# Patient Record
Sex: Male | Born: 1950 | Race: White | Hispanic: No | Marital: Married | Smoking: Never smoker
Health system: Southern US, Community
[De-identification: ages and names within clinical notes are randomized; demographics above are authoritative.]

## PROBLEM LIST (undated history)

## (undated) DIAGNOSIS — E7800 Pure hypercholesterolemia, unspecified: Secondary | ICD-10-CM

## (undated) DIAGNOSIS — I2510 Atherosclerotic heart disease of native coronary artery without angina pectoris: Secondary | ICD-10-CM

## (undated) DIAGNOSIS — I10 Essential (primary) hypertension: Secondary | ICD-10-CM

## (undated) DIAGNOSIS — E119 Type 2 diabetes mellitus without complications: Secondary | ICD-10-CM

## (undated) DIAGNOSIS — F419 Anxiety disorder, unspecified: Secondary | ICD-10-CM

## (undated) HISTORY — DX: Type 2 diabetes mellitus without complications: E11.9

## (undated) HISTORY — DX: Pure hypercholesterolemia, unspecified: E78.00

## (undated) HISTORY — DX: Atherosclerotic heart disease of native coronary artery without angina pectoris: I25.10

## (undated) HISTORY — DX: Anxiety disorder, unspecified: F41.9

---

## 1999-09-10 ENCOUNTER — Inpatient Hospital Stay (HOSPITAL_COMMUNITY): Admission: EM | Admit: 1999-09-10 | Discharge: 1999-09-20 | Payer: Self-pay | Admitting: Surgery

## 1999-09-10 ENCOUNTER — Encounter (INDEPENDENT_AMBULATORY_CARE_PROVIDER_SITE_OTHER): Payer: Self-pay | Admitting: *Deleted

## 1999-09-10 ENCOUNTER — Encounter: Payer: Self-pay | Admitting: Surgery

## 1999-09-14 ENCOUNTER — Encounter: Payer: Self-pay | Admitting: General Surgery

## 1999-11-24 ENCOUNTER — Encounter: Payer: Self-pay | Admitting: Surgery

## 1999-11-24 ENCOUNTER — Encounter: Admission: RE | Admit: 1999-11-24 | Discharge: 1999-11-24 | Payer: Self-pay | Admitting: Surgery

## 2006-06-21 HISTORY — PX: ROTATOR CUFF REPAIR: SHX139

## 2007-01-15 ENCOUNTER — Encounter: Admission: RE | Admit: 2007-01-15 | Discharge: 2007-01-15 | Payer: Self-pay | Admitting: Orthopedic Surgery

## 2007-03-02 ENCOUNTER — Encounter: Admission: RE | Admit: 2007-03-02 | Discharge: 2007-04-19 | Payer: Self-pay | Admitting: Orthopedic Surgery

## 2007-11-14 ENCOUNTER — Encounter: Admission: RE | Admit: 2007-11-14 | Discharge: 2007-11-14 | Payer: Self-pay | Admitting: Family Medicine

## 2010-11-06 NOTE — Discharge Summary (Signed)
[place]. [facility]  Patient:    Robert Mooney, Robert Mooney                      MRN: [id number] Adm. Date:  [id number] Disc. Date: [id number] Attending:  Andre Lefort CC:         Nicki Reaper, M.D.             Elpidio Anis, M.D., [place], [place]                           Discharge Summary  DATE OF BIRTH:  [birth year]  DISCHARGE DIAGNOSES: 1. Diverticulitis with intra-abdominal abscesses. 2. Hypokalemia. 3. Normal appendix. 4. History of left elbow pain. 5. Moderate obesity.  OPERATIONS:  The patient had a laparoscopic examination with laparoscopic appendectomy and drainage of intra-abdominal abscesses on September 16, 1999.  HISTORY OF PRESENT ILLNESS:  Mr. Tisdale is a 60 year old white male who has no primary doctor.  He has been seeing Dr. Cindee Salt for left elbow pain.  He developed vague abdominal discomfort on Monday, September 07, 1999.  This pain became more severe, and he complained of pain radiating to the end of his penis and rectum.  His initial evaluation in the emergency room was negative, but then he re-presented to [facility] for what was felt to be, by CT scan, low sigmoid rectal inflammation.  He was admitted by Dr. Elpidio Anis, but had protracted vomiting and requested transfer to [facility].  On September 10, 1999, he was transferred to my service at [facility].  The patient gives no prior gastrointestinal history.  He is moderately obese. It is interesting that about three days before this all began he gave himself an enema because of some mild constipation.  ALLERGIES:  He denied any allergies.  MEDICATIONS:  Only medication includes Celebrex for his elbow.  PAST SURGICAL HISTORY:  Bilateral carpal tunnel surgery and right elbow surgery by Dr. Merlyn Lot.  SOCIAL HISTORY:  The patient worked Public librarian.  PHYSICAL EXAMINATION:  On admission his temperature was 98.8, pulse of 80.  He had lower abdominal  tenderness, right being worse than the left, and he was obese.  HOSPITAL COURSE:  Initial assessment was that he probably had diverticulitis. I doubted he had a rectal perforation, though his history of an enema was interesting.  We placed him on Unasyn and gentamicin which had been started at [facility] and continued this.  He did develop some mild hypokalemia during this time which was treated.  The patient seemed to get better to some extent clinically, though continued to complain of right lower quadrant abdominal pain, and this is where the most ______ inflammatory mass was on a repeat CT scan done on September 14, 1999.  In reviewing this with Dr. Maple Hudson, there was concern whether he had an appendicitis which had not been diagnosed.  I discussed this thoroughly with him about the treatment options and, on September 16, 1999, he was taken to the operating room where he underwent an exploratory laparoscopy.  His appendix was noted to be normal, but was removed.  He had three abscesses, the largest of which was in the right lower quadrant, a smaller one in the left lower quadrant, and one in the pelvis.  These were all drained through the laparoscope.  The patients antibiotics were continued.  By the second postoperative day on September 18, 1999, he was started on clear liquids, and by September 20, 1999, he was afebrile, feeling better, and ready for discharge.  I planned to keep him on the Cipro and Flagyl for six days after discharge, which would be a total of 10 days of antibiotics.  He was given Vicodin for pain.  He was given Cipro 1 tablet twice a day, Flagyl 1 tablet four times a day.  He will see me back in 7-10 days.  Stay on a low fat diet.  Call for any intervening problems.  The final pathology of his appendix showed periappendicitis consistent with probable external abscess.  CONDITION ON DISCHARGE:  Good. DD:  10/18/99 TD:  10/18/99 Job: [id number]

## 2010-11-06 NOTE — H&P (Signed)
[place]. [facility]  Patient:    Robert Mooney, Robert Mooney                      MRN: [id number] Adm. Date:  [id number] Attending:  Andre Lefort CC:         Nicki Reaper, M.D.             Elpidio Anis, M.D.                         History and Physical  DATE OF BIRTH:  February 06, 1951  HISTORY OF PRESENT ILLNESS:  Robert Mooney is a 60 year old white male who has no primary doctor.  In fact, Robert Mooney kind of brags about not seeing a physician Robert Mooney entire life.  Robert Mooney has seen Dr. Cindee Salt for a bilateral carpal tunnel surgery and is currently being treated with Celebrex for left elbow pain.  The patient was in Robert Mooney usual good health when on Monday the 19th of March 2001 e just got to where Robert Mooney did not feel good.  Robert Mooney started having some diarrhea, went home, then some time that evening developed severe pain which started in Robert Mooney penis and rectum and Robert Mooney thought went up inside of him.  Robert Mooney had some friends who had had kidney stones and they suggested maybe Robert Mooney could be having a kidney stone attack. The patient himself has had no history of kidney stones.  I think Robert Mooney has a family history of kidney stones.  Robert Mooney was taken to [facility] Emergency Room Monday evening, was evaluated, and by Robert Mooney story nothing was found.  Was discharged home.  Robert Mooney returned to [place], however, within an hour or two of returning home early Tuesday morning developed severe pain.  Went to [employer] office who called the ambulance who took him back to [facility] Emergency Room.  Robert Mooney was then evaluated and admitted by Dr. Katrinka Blazing.  Robert Mooney was found to have a white blood count of 16,200 with 20% bands.  A repeat CT scan showed some localized free perirectal gas and free pelvic fluid compatible with a questionable low sigmoid, high rectal perforation.  A questionable diagnosis was that of diverticular disease.  Patient strongly denies any rectal trauma or injury.  Robert Mooney is single but Robert Mooney wife died a  number of years ago.  Robert Mooney lives by himself.  The patient denies any prior history of peptic ulcer disease, liver disease, pancreatic disease, colon disease, or abdominal surgery.  Again, as mentioned previously, Robert Mooney really has no medical doctor that follows him.  During Robert Mooney hospitalization at [facility] Robert Mooney had trouble with diarrhea stools in  which Robert Mooney had a bowel movement every one to two hours which were loose.  Robert Mooney had protracted nausea and vomiting and then this morning the 22nd of March 2001 requested transfer to [facility].  ALLERGIES:  None.  MEDICATIONS:  Celebrex prior to this admission.  PAST SURGICAL HISTORY:  Bilateral carpal tunnel surgery and right elbow surgery by Dr. Cindee Salt.  Is currently being evaluated by Dr. Merlyn Lot for left elbow pain.  REVIEW OF SYSTEMS:  PULMONARY:  Denies history of pneumonia, tuberculosis. CARDIAC:  Robert Mooney has had no chest pain.  No hypertension.  No cardiac evaluation. GASTROINTESTINAL:  See history of present illness.  UROLOGIC:  No kidney stones or prostate trouble.  SOCIAL HISTORY:  Robert Mooney works for [employer].  PHYSICAL EXAMINATION:  VITAL  SIGNS:  Temperature 98.8.  Pulse 80.  Blood pressure 140/85.  GENERAL:  Robert Mooney is a heavy man who is alert.  Speaks with somewhat a rapid speech.  HEENT:  Unremarkable.  NECK:  Supple.  I feel no mass, no thyromegaly.  LUNGS:  Clear to auscultation and symmetric.  HEART:  Regular rate and rhythm.  ABDOMEN:  Obese.  Robert Mooney is tender with some guarding along Robert Mooney right side, more right lower quadrant than right upper quadrant.  Actually, Robert Mooney left side is surprisingly free of pain or discomfort.  I see no evidence of abdominal hernia, though Robert Mooney obesity probably limits to some extent Robert Mooney exam.  GENITOURINARY:  Robert Mooney has no testicular pain or penile trauma.  RECTAL:  Unremarkable for mass, tenderness.  Robert Mooney prostate is unremarkable.  LABORATORIES:  Pending at the time of  dictation.  ASSESSMENT: 1. 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  Robert Mooney appears clinically better    than Robert Mooney was a couple of days ago on the IV antibiotics.  Will update Robert Mooney CBC and    laboratories.  Consider repeating CT scan within one to three days and follow    clinically. 2. History of intractable nausea, though Robert Mooney seems to be doing better at this    current time. 3. Arthritis of Robert Mooney left elbow. DD:  09/10/99 TD:  09/10/99 Job: [id number]

## 2010-11-06 NOTE — Op Note (Signed)
[place]. [facility]  Patient:    Robert Mooney, Robert Mooney                      MRN: [id number] Proc. Date: 09/16/99 Adm. Date:  [id number] Attending:  Andre Lefort                           Operative Report  DATE OF BIRTH:  November 27, 1950.  PREOPERATIVE DIAGNOSIS:  Possible ruptured appendicitis.  POSTOPERATIVE DIAGNOSIS:  Normal-appearing appendix with inflammatory mass and changes of his cecum and right lower quadrant, three small abscesses of the right lower quadrant, left lower quadrant and prepelvic area, probable diverticulitis of the midsigmoid colon.  OPERATION PERFORMED:  Laparoscopic appendectomy, drainage of intra-abdominal abscesses, and irrigation laparoscopically.  SURGEON:  Sandria Bales. Ezzard Standing, M.D.  ASSISTANT:  Thornton Park. Daphine Deutscher, M.D.  ANESTHESIA:  General endotracheal.  ESTIMATED BLOOD LOSS:  50 cc.  DRAINS:  None.  DESCRIPTION OF PROCEDURE:  The patient was placed in a supine position and given a general endotracheal anesthesia.  His left arm was tucked to his side.  His Foley catheter was placed.  He was already on antibiotics of Unasyn and gentamicin. is abdomen was prepped with Betadine solution and sterilely draped.  An infraumbilical incision was made with sharp dissection carried down to the abdominal cavity. pon entering the abdominal cavity ____________ inflammatory mass was noted to be in  the right lower quadrant.  I put two additional trocars, a 5 mm right subcostal  trocar and a 10 mm trocar midway between the umbilicus and the xiphoid bone.  I was able to then ____________ dissection.  I discovered an abscess in the left lower quadrant which I aspirated and drained.  I discovered an abscess in the right lower quadrant which I aspirated and drained, and a prepelvic abscess.  I took the wall of one of the abscesses and sent it for culture and sensitivity. The patient had primarily an inflammatory mass in the  right lower quadrant.  I was able to take, however, all this omentum down, open up the bowel loops but identified no primary bowel problem.  I was able to free up the appendix.  The appendix itself looked  grossly normal.  I freed up the base of the appendix using both a Harmonic scalpel. and clips.  In the mesentery of the appendix and base of the appendix I used a vascular endo GIA stapler divided and delivered the appendix in an Endocatch bag and sent it to pathology.  The cecum itself had no obvious inflammation, no evidence of tumor.  There was no evidence of Crohns disease.  The terminal bowel though inflamed, was otherwise soft and pliable without evidence of bowel injury.  I irrigated the abdomen with about 5 liters of saline.  I think the primary point of his injury was probably a midsigmoid colon diverticulitis.  I did not try to  take this entirely down.  I think it is pretty well walled off.  I stopped my operation after thorough irrigation and felt that I had irrigated his abscesses out well, will continue on IV antibiotics and go from there.  The trocars were then all removed under direct visualization.  There was no bleeding from any trocar site.  The umbilical trocar was closed with a 0 Vicryl  suture that I had placed as a pursestring for the original placement of the Hasson trocar.  I then closed the skin with a 5-0 subcuticular Vicryl suture, painted ith tincture of benzoin, Steri-Strips and sterilely dressed.  The patient tolerated the procedure well was transported to recovery room in good condition. DD:  09/16/99 TD:  09/17/99 Job: [id number]

## 2014-07-16 ENCOUNTER — Other Ambulatory Visit (HOSPITAL_BASED_OUTPATIENT_CLINIC_OR_DEPARTMENT_OTHER): Payer: Self-pay | Admitting: Physician Assistant

## 2014-07-16 DIAGNOSIS — M5430 Sciatica, unspecified side: Secondary | ICD-10-CM

## 2014-07-20 ENCOUNTER — Ambulatory Visit (HOSPITAL_BASED_OUTPATIENT_CLINIC_OR_DEPARTMENT_OTHER)
Admission: RE | Admit: 2014-07-20 | Discharge: 2014-07-20 | Disposition: A | Discharge status: Home / Self Care | Payer: Medicare Other | Source: Ambulatory Visit | Attending: Physician Assistant | Admitting: Physician Assistant

## 2014-07-20 DIAGNOSIS — M25552 Pain in left hip: Secondary | ICD-10-CM | POA: Diagnosis not present

## 2014-07-20 DIAGNOSIS — M79604 Pain in right leg: Secondary | ICD-10-CM | POA: Insufficient documentation

## 2014-07-20 DIAGNOSIS — M79605 Pain in left leg: Secondary | ICD-10-CM | POA: Diagnosis not present

## 2014-07-20 DIAGNOSIS — M25551 Pain in right hip: Secondary | ICD-10-CM | POA: Insufficient documentation

## 2014-07-20 DIAGNOSIS — M5430 Sciatica, unspecified side: Secondary | ICD-10-CM | POA: Diagnosis present

## 2014-07-20 DIAGNOSIS — M5136 Other intervertebral disc degeneration, lumbar region: Secondary | ICD-10-CM | POA: Insufficient documentation

## 2015-04-08 ENCOUNTER — Ambulatory Visit (INDEPENDENT_AMBULATORY_CARE_PROVIDER_SITE_OTHER): Payer: Medicare Other | Admitting: Neurology

## 2015-04-08 ENCOUNTER — Encounter: Payer: Self-pay | Admitting: Neurology

## 2015-04-08 VITALS — BP 134/77 | HR 72 | Ht 68.0 in | Wt 221.8 lb

## 2015-04-08 DIAGNOSIS — G609 Hereditary and idiopathic neuropathy, unspecified: Secondary | ICD-10-CM

## 2015-04-08 DIAGNOSIS — M5441 Lumbago with sciatica, right side: Secondary | ICD-10-CM

## 2015-04-08 DIAGNOSIS — G8929 Other chronic pain: Secondary | ICD-10-CM

## 2015-04-08 DIAGNOSIS — M48061 Spinal stenosis, lumbar region without neurogenic claudication: Secondary | ICD-10-CM

## 2015-04-08 DIAGNOSIS — M5442 Lumbago with sciatica, left side: Secondary | ICD-10-CM | POA: Diagnosis not present

## 2015-04-08 DIAGNOSIS — M4806 Spinal stenosis, lumbar region: Secondary | ICD-10-CM | POA: Diagnosis not present

## 2015-04-08 NOTE — Progress Notes (Signed)
[facility]    Provider:  Dr Jaynee Eagles Referring Provider: Corine Shelter, PA-C Primary Care Physician:  Beatris Si  CC:  Numbness in the feet  HPI:  Robert Mooney is a 64 y.o. male here as a referral from Dr. Aron Baba for numbness in the feet. PMHx of DM, HLD, anxiety, chronic LBP and spinal stenosis. He has left arm pain with shooting pains in the left arm all the way down to the wrist. He put an ice pack on his arm, took gabapentin, severe pain, It felt like a needle in his arm. He put ice on his low back and it went away. Has not had a repeat incident of left arm pain. No neck pain. He has less severe arm pain in the past  but it was positional. His fingers have been going numb for a long time. The left fingers will go numb and he has decreased grip and dropping things. He denies headaches, double vision, changes in speech or swallow. He is having vision changes and facial weakness with drooling. He has unsteadiness. Episode of confusion and dizziness. He has right-sided sciatica. He has to put ice on his back to walk. He can't sit in his kayak and paddle because his feet go numb. He legs go numb and they ache. He has needle pains into the foot. He has sharp pains in the right sciatic nerve. The pain radiates into the buttocks and thighs. He has numbness in the feet only when riding in the kayak; the kayak has foot braces and he has to keep his legs straight for long period of time and then he has numbness in his feet and it goes up to the ankles. No numbness otherwise, no cramping in the feet, he has some balance problems and feels unstable when he first gets up out of chair. Pain in the feet are positional, no continuous burning or tingling or sensory symptoms. He endorses his sensory symptoms in the feet only when he is in the kayak, resolves when he gets out of the kayak. No bowel or bladder dysfunction.   Reviewed notes, labs and imaging from outside physicians, which  showed: he was sent by [facility] by Dr. Nelva Bush for chronic LBP and radicular symptoms and numbness in the feet to evaluate for any other etiology of his biltaeral foot numbness. He is on Neurontin for the pain. He has had low back injections and ESI without significant relief.   MRI of the lumbar spine 06/2014: personally reviewed and agree with following:  Disc levels:  L1-L2: Negative.  L2-L3: Mild disc desiccation. Shallow circumferential disc bulge without stenosis.  L3-L4: Development of disc degeneration compared to the prior exam. Shallow broad-based posterior disc bulging without resulting stenosis. Central canal is capacious. 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 The RIGHT neural foramen appears patent.  L4-L5: Disc desiccation and degeneration. RIGHT eccentric broad-based posterior disc bulging is present. Bulging disc contacts both descending L5 nerves without compression. There is mild symmetric bilateral foraminal stenosis secondary to short pedicles and disc bulging.  L5-S1: Disc degeneration. Small central annular fissure and shallow central protrusion that appears similar to the prior exam. The protrusion approaches both descending S1 nerves in the subarticular zones but there is no compression or effacement of CSF in the nerve root sleeves. Both neural foramina are patent.  IMPRESSION: Mild progression of degenerative disc disease, most notable L3-L4. Other levels  appear similar to the prior study. Congenitally short pedicles with mild superimposed disc bulging.  Review of Systems: Patient complains of symptoms per HPI as well as the following symptoms: restless legs, anxiety. Pertinent negatives per HPI. All others negative.   Social History   Social History  . Marital Status: Married    Spouse Name: Enid Derry   . Number of Children: 0  . Years of  Education: 12   Occupational History  . Retired    Social History Main Topics  . Smoking status: Never Smoker   . Smokeless tobacco: Not on file  . Alcohol Use: No  . Drug Use: No  . Sexual Activity: Not on file   Other Topics Concern  . Not on file   Social History Narrative   Lives with wife, Enid Derry.   Caffeine use: Drinks coffee (2 cups per day)    Family History  Problem Relation Age of Onset  . Neuropathy Neg Hx     Past Medical History  Diagnosis Date  . Diabetes ([place])   . High cholesterol   . Anxiety     Past Surgical History  Procedure Laterality Date  . Rotator cuff repair Right 2008    Current Outpatient Prescriptions  Medication Sig Dispense Refill  . ACCU-CHEK AVIVA PLUS test strip TEST BLOOD SUGAR ONCE A DAY  11  . Acetaminophen (TYLENOL EXTRA STRENGTH PO) Take 1 tablet by mouth as needed.     . gabapentin (NEURONTIN) 300 MG capsule TAKE ONE CAPSULE BY MOUTH AT BEDTIME X 5 DAYS, 1 CAP TWICE DAILY X 5 DAYS, 1 CAP 3 TIMES DAILY  0  . lisinopril (PRINIVIL,ZESTRIL) 5 MG tablet Take 5 mg by mouth daily.  0  . metFORMIN (GLUCOPHAGE) 500 MG tablet Take 500 mg by mouth 2 (two) times daily.  1  . naproxen sodium (ANAPROX) 220 MG tablet Take 220 mg by mouth 2 (two) times daily with a meal.     . Omega-3 Fatty Acids (FISH OIL PO) Take 2 capsules by mouth daily. 1000     No current facility-administered medications for this visit.    Allergies as of 04/08/2015  . (No Known Allergies)    Vitals: BP 134/77 mmHg  Pulse 72  Ht 5\' 8"  (1.727 m)  Wt 221 lb 12.8 oz (100.608 kg)  BMI 33.73 kg/m2 Last Weight:  Wt Readings from Last 1 Encounters:  04/08/15 221 lb 12.8 oz (100.608 kg)   Last Height:   Ht Readings from Last 1 Encounters:  04/08/15 5\' 8"  (1.727 m)   Physical exam: Exam: Gen: NAD, conversant, well nourised, obese, well groomed                     CV: RRR, no MRG. No Carotid Bruits. No peripheral edema, warm, nontender Eyes: Conjunctivae  clear without exudates or hemorrhage  Neuro: Detailed Neurologic Exam  Speech: mildly dysarthric    Speech is normal; fluent and spontaneous with normal comprehension.  Cognition:    The patient is oriented to person, place, and time;     recent and remote memory intact;     language fluent;     normal attention, concentration,     fund of knowledge Cranial Nerves:    The pupils are equal, round, and reactive to light. Attempted findoscopic exam coul dnot visualize due to small pupils. Visual fields are full to finger confrontation. Extraocular movements are intact. Trigeminal sensation is intact and the muscles of mastication are normal.  The face is symmetric. The palate elevates in the midline. Hearing intact to voice. Voice is normal. Shoulder shrug is normal. The tongue has normal motion without fasciculations.   Coordination:    No dysmetria   Gait:    Not ataxic  Motor Observation:    No asymmetry, no atrophy, and no involuntary movements noted. Tone:    Normal muscle tone.    Posture:    Posture is normal. normal erect    Strength:    Strength is intact  in the upper and lower limbs.      Sensation: intact to LT. Decreased temperature distally, intact pin prick, intact vibration and intact proprioception     Reflex Exam:  DTR's:    Deep tendon reflexes in the upper and lower extremities are brisk bilaterally.   Toes:    The toes are equivocal bilaterally.   Clonus:    Clonus is absent.       Assessment/Plan:  a 64 y.o. male here as a referral from Dr. Aron Baba for numbness in the feet. PMHx of DM, HLD, anxiety, chronic LBP and spinal stenosis. He has some very mild sensoty changes in the feet likely secondary to diabetic neuropathy however I feel the numbness in his feet is secondary to his spinal stenosis since the significant numbness is positional an din the Kayak only. Still will order recommend a serum neuropathy panel for other cause of his mild distal  sensory changes to his pcp. For his diziness, imbalance and neck pain with radicular symptoms I offered patient an MRI of the brain, an MRI of the cervical cord and emg/ncs however he does not want to underatake this at this time. Symptoms in his arm have resolved, he has no significant neck pain and he feels dizziness is only upon standing (which could be mild orthostasis).   Positional numbness in the feet likely secondary to his degenerative lumbar spine disease and spinal stenosis Offered MRI of the brain and cervical cord for dizziness and imbalance and cervical radiculopathy, they decline For his mild sensory changes on exam, suggest neuropathy workup for pcp, patient prefers me to send my suggestions to his pcp instead of doing them here in clinic. I do suggest a TSH, B12 and folate, Serum IFE and SPEP,  if not already completed. Can follow up with me as needed.   CC: Drs Aron Baba and Campbell Riches, [facility] [address] [place], [place] [address]  Phone [phone] Fax [phone]

## 2015-04-08 NOTE — Patient Instructions (Signed)

## 2015-04-11 ENCOUNTER — Encounter: Payer: Self-pay | Admitting: Neurology

## 2015-04-11 DIAGNOSIS — M48061 Spinal stenosis, lumbar region without neurogenic claudication: Secondary | ICD-10-CM | POA: Insufficient documentation

## 2015-04-11 DIAGNOSIS — M5442 Lumbago with sciatica, left side: Secondary | ICD-10-CM

## 2015-04-11 DIAGNOSIS — E119 Type 2 diabetes mellitus without complications: Secondary | ICD-10-CM | POA: Insufficient documentation

## 2015-04-11 DIAGNOSIS — G8929 Other chronic pain: Secondary | ICD-10-CM | POA: Insufficient documentation

## 2015-04-11 DIAGNOSIS — M5441 Lumbago with sciatica, right side: Secondary | ICD-10-CM

## 2015-04-11 DIAGNOSIS — E1169 Type 2 diabetes mellitus with other specified complication: Secondary | ICD-10-CM | POA: Insufficient documentation

## 2015-04-11 DIAGNOSIS — G609 Hereditary and idiopathic neuropathy, unspecified: Secondary | ICD-10-CM | POA: Insufficient documentation

## 2015-06-09 ENCOUNTER — Ambulatory Visit (INDEPENDENT_AMBULATORY_CARE_PROVIDER_SITE_OTHER): Payer: Medicare Other | Admitting: Neurology

## 2015-06-09 ENCOUNTER — Encounter: Payer: Self-pay | Admitting: Neurology

## 2015-06-09 VITALS — BP 156/91 | HR 65 | Ht 68.0 in | Wt 226.6 lb

## 2015-06-09 DIAGNOSIS — G8929 Other chronic pain: Secondary | ICD-10-CM

## 2015-06-09 DIAGNOSIS — M5442 Lumbago with sciatica, left side: Secondary | ICD-10-CM

## 2015-06-09 DIAGNOSIS — M5441 Lumbago with sciatica, right side: Secondary | ICD-10-CM | POA: Diagnosis not present

## 2015-06-09 MED ORDER — PREGABALIN 50 MG PO CAPS: 50.0000 mg | ORAL_CAPSULE | Freq: Two times a day (BID) | ORAL | Status: DC

## 2015-06-09 NOTE — Patient Instructions (Addendum)
Remember to drink plenty of fluid, eat healthy meals and do not skip any meals. Try to eat protein with a every meal and eat a healthy snack such as fruit or nuts in between meals. Try to keep a regular sleep-wake schedule and try to exercise daily, particularly in the form of walking, 20-30 minutes a day, if you can.   As far as your medications are concerned, I would like to suggest: Discuss Lyrica with Dr. Nelva Bush. Will start at 50mg  twice daily. Take Gabapentin 300mg  three times a day for 3 days then stop.   Our phone number is [phone]. We also have an after hours call service for urgent matters and there is a physician on-call for urgent questions. For any emergencies you know to call 911 or go to the nearest emergency room

## 2015-06-09 NOTE — Progress Notes (Signed)
[facility]    Provider:  Dr Jaynee Eagles Referring Provider: Corine Shelter, PA-C Primary Care Physician:  Beatris Si  CC: Numbness in the feet  Interval History 06/09/2015: Robert Mooney is a 64 y.o. male here as a follow up for chronic low back pain.  PMHx of DM, HLD, anxiety, chronic LBP and spinal stenosis. Patient is here for follow-up. He has been evaluated by orthopedic surgery and was told there is no surgical intervention available for his low back symptoms. He continues to have significant pain in the low back which radiates down his legs. He has been treated in pain management clinic and a spinal stimulator has been recommended. He endorses bilateral severe lumbar low back pain and lumbar radiculopathy. The pain in his low back is worse when sitting or laying down for too long and he gets very stiff. He came in today because he would like to know if there is anything that can be done to fix his low back pain with radicular symptoms. Had a long discussion with patient and wife, if there is no surgical intervention then medical management, exercise, weight loss, physical therapy, and pain management recommended.   HPI: Robert Mooney is a 64 y.o. male here as a referral from Dr. Aron Baba for numbness in the feet. PMHx of DM, HLD, anxiety, chronic LBP and spinal stenosis. He has left arm pain with shooting pains in the left arm all the way down to the wrist. He put an ice pack on his arm, took gabapentin, severe pain, It felt like a needle in his arm. He put ice on his low back and it went away. Has not had a repeat incident of left arm pain. No neck pain. He has less severe arm pain in the past but it was positional. His fingers have been going numb for a long time. The left fingers will go numb and he has decreased grip and dropping things. He denies headaches, double vision, changes in speech or swallow. He is having vision changes and facial weakness with drooling. He  has unsteadiness. Episode of confusion and dizziness. He has right-sided sciatica. He has to put ice on his back to walk. He can't sit in his kayak and paddle because his feet go numb. He legs go numb and they ache. He has needle pains into the foot. He has sharp pains in the right sciatic nerve. The pain radiates into the buttocks and thighs. He has numbness in the feet only when riding in the kayak; the kayak has foot braces and he has to keep his legs straight for long period of time and then he has numbness in his feet and it goes up to the ankles. No numbness otherwise, no cramping in the feet, he has some balance problems and feels unstable when he first gets up out of chair. Pain in the feet are positional, no continuous burning or tingling or sensory symptoms. He endorses his sensory symptoms in the feet only when he is in the kayak, resolves when he gets out of the kayak. No bowel or bladder dysfunction.   Reviewed notes, labs and imaging from outside physicians, which showed: he was sent by [facility] by Dr. Nelva Bush for chronic LBP and radicular symptoms and numbness in the feet to evaluate for any other etiology of his biltaeral foot numbness. He is on Neurontin for the pain. He has had low back injections and ESI without significant relief.   MRI of the lumbar spine  06/2014: personally reviewed and agree with following:  Disc levels:  L1-L2: Negative.  L2-L3: Mild disc desiccation. Shallow circumferential disc bulge without stenosis.  L3-L4: Development of disc degeneration compared to the prior exam. Shallow broad-based posterior disc bulging without resulting stenosis. Central canal is capacious. 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 The RIGHT neural foramen appears patent.  L4-L5: Disc desiccation and degeneration. RIGHT eccentric broad-based posterior disc bulging is present.  Bulging disc contacts both descending L5 nerves without compression. There is mild symmetric bilateral foraminal stenosis secondary to short pedicles and disc bulging.  L5-S1: Disc degeneration. Small central annular fissure and shallow central protrusion that appears similar to the prior exam. The protrusion approaches both descending S1 nerves in the subarticular zones but there is no compression or effacement of CSF in the nerve root sleeves. Both neural foramina are patent.  IMPRESSION: Mild progression of degenerative disc disease, most notable L3-L4. Other levels appear similar to the prior study. Congenitally short pedicles with mild superimposed disc bulging.   Review of Systems: Patient complains of symptoms per HPI as well as the following symptoms: Back pain, dizziness, headache, weakness, confusion. Pertinent negatives per HPI. All others negative.   Social History   Social History  . Marital Status: Married    Spouse Name: Enid Derry   . Number of Children: 0  . Years of Education: 12   Occupational History  . Retired    Social History Main Topics  . Smoking status: Never Smoker   . Smokeless tobacco: Not on file  . Alcohol Use: No  . Drug Use: No  . Sexual Activity: Not on file   Other Topics Concern  . Not on file   Social History Narrative   Lives with wife, Enid Derry.   Caffeine use: Drinks coffee (2 cups per day)    Family History  Problem Relation Age of Onset  . Neuropathy Neg Hx     Past Medical History  Diagnosis Date  . Diabetes ([place])   . High cholesterol   . Anxiety     Past Surgical History  Procedure Laterality Date  . Rotator cuff repair Right 2008    Current Outpatient Prescriptions  Medication Sig Dispense Refill  . ACCU-CHEK AVIVA PLUS test strip TEST BLOOD SUGAR ONCE A DAY  11  . Acetaminophen (TYLENOL EXTRA STRENGTH PO) Take 1 tablet by mouth as needed.     Marland Kitchen lisinopril (PRINIVIL,ZESTRIL) 5 MG tablet Take 5 mg by mouth daily.  0    . metFORMIN (GLUCOPHAGE) 500 MG tablet Take 500 mg by mouth 2 (two) times daily.  1  . naproxen sodium (ANAPROX) 220 MG tablet Take 220 mg by mouth 2 (two) times daily with a meal.     . Omega-3 Fatty Acids (FISH OIL PO) Take 2 capsules by mouth daily. 1000    . pregabalin (LYRICA) 50 MG capsule Take 1 capsule (50 mg total) by mouth 2 (two) times daily. 60 capsule 2   No current facility-administered medications for this visit.    Allergies as of 06/09/2015  . (No Known Allergies)    Vitals: BP 156/91 mmHg  Pulse 65  Ht 5\' 8"  (1.727 m)  Wt 226 lb 9.6 oz (102.785 kg)  BMI 34.46 kg/m2 Last Weight:  Wt Readings from Last 1 Encounters:  06/09/15 226 lb 9.6 oz (102.785 kg)   Last Height:   Ht Readings from Last 1 Encounters:  06/09/15 5\' 8"  (1.727 m)     Motor Observation:  No asymmetry, no atrophy, and no involuntary movements noted. Tone:  Normal muscle tone.   Posture:  Posture is normal. normal erect   Strength:  Strength is intact in the upper and lower limbs.    Sensation: intact to LT. Decreased temperature distally, intact pin prick, intact vibration and intact proprioception   Reflex Exam:  DTR's:  Deep tendon reflexes in the upper and lower extremities are brisk bilaterally.  Toes:  The toes are equivocal bilaterally.  Clonus:  Clonus is absent.      Assessment/Plan: a 64 y.o. male here as a referral from Dr. Aron Baba for numbness in the feet and chronic low back pain. PMHx of DM, HLD, anxiety, chronic LBP and spinal stenosis. He has some very mild sensoty changes in the feet likely secondary to diabetic neuropathy however I feel the numbness in his feet also may be secondary to his spinal stenosis since the significant numbness is positional and in the Kayak only. Still will order recommend a serum neuropathy panel for other cause of his mild distal sensory changes to his pcp. For his diziness, imbalance and neck pain with  radicular symptoms I offered patient an MRI of the brain, an MRI of the cervical cord and emg/ncs however he does not want to underatake this at this time. Symptoms in his arm have resolved, he has no significant neck pain and he feels dizziness is only upon standing (which could be mild orthostasis).   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 Had a long discussion with patient and wife, if there is no surgical intervention then medical.  management, exercise, weight loss, physical therapy, and pain management recommended. Will start Lyrica, can titrate with dr. Nelva Bush Positional numbness in the feet may be secondary to his degenerative lumbar spine disease and spinal stenosis as well as possible neuropathy.  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 I do suggest a TSH, B12 and folate, Serum IFE and SPEP, if not already completed. Can follow up with Dr. Campbell Riches, MD  [facility] [address] [place], [place] [address]  Phone [phone] Fax [phone]  A total of 30 minutes was spent face-to-face with this patient. Over half this time was spent on counseling patient on the chronic LBP with degenerative disk disease and lumbar radiculopathy, small-fiber neuropathy  diagnosis and different diagnostic and therapeutic options available.

## 2019-09-05 ENCOUNTER — Encounter (HOSPITAL_COMMUNITY): Payer: Self-pay | Admitting: Emergency Medicine

## 2019-09-05 ENCOUNTER — Emergency Department (HOSPITAL_COMMUNITY): Payer: Medicare Other

## 2019-09-05 ENCOUNTER — Emergency Department (HOSPITAL_COMMUNITY)
Admission: EM | Admit: 2019-09-05 | Discharge: 2019-09-05 | Disposition: A | Discharge status: Home / Self Care | Payer: Medicare Other | Attending: Emergency Medicine | Admitting: Emergency Medicine

## 2019-09-05 ENCOUNTER — Other Ambulatory Visit: Payer: Self-pay

## 2019-09-05 DIAGNOSIS — R42 Dizziness and giddiness: Secondary | ICD-10-CM

## 2019-09-05 DIAGNOSIS — I10 Essential (primary) hypertension: Secondary | ICD-10-CM | POA: Diagnosis not present

## 2019-09-05 DIAGNOSIS — E1140 Type 2 diabetes mellitus with diabetic neuropathy, unspecified: Secondary | ICD-10-CM | POA: Insufficient documentation

## 2019-09-05 DIAGNOSIS — R531 Weakness: Secondary | ICD-10-CM | POA: Insufficient documentation

## 2019-09-05 DIAGNOSIS — E119 Type 2 diabetes mellitus without complications: Secondary | ICD-10-CM | POA: Diagnosis not present

## 2019-09-05 DIAGNOSIS — Z7984 Long term (current) use of oral hypoglycemic drugs: Secondary | ICD-10-CM | POA: Insufficient documentation

## 2019-09-05 DIAGNOSIS — Z79899 Other long term (current) drug therapy: Secondary | ICD-10-CM | POA: Insufficient documentation

## 2019-09-05 HISTORY — DX: Essential (primary) hypertension: I10

## 2019-09-05 LAB — URINALYSIS, ROUTINE W REFLEX MICROSCOPIC
Bilirubin Urine: NEGATIVE
Glucose, UA: NEGATIVE mg/dL
Hgb urine dipstick: NEGATIVE
Ketones, ur: 5 mg/dL — AB
Leukocytes,Ua: NEGATIVE
Nitrite: NEGATIVE
Protein, ur: NEGATIVE mg/dL
Specific Gravity, Urine: 1.035 — ABNORMAL HIGH (ref 1.005–1.030)
pH: 6 (ref 5.0–8.0)

## 2019-09-05 LAB — DIFFERENTIAL
Abs Immature Granulocytes: 0.03 10*3/uL (ref 0.00–0.07)
Basophils Absolute: 0.1 10*3/uL (ref 0.0–0.1)
Basophils Relative: 1 %
Eosinophils Absolute: 0.1 10*3/uL (ref 0.0–0.5)
Eosinophils Relative: 1 %
Immature Granulocytes: 1 %
Lymphocytes Relative: 19 %
Lymphs Abs: 1.2 10*3/uL (ref 0.7–4.0)
Monocytes Absolute: 0.7 10*3/uL (ref 0.1–1.0)
Monocytes Relative: 11 %
Neutro Abs: 4.4 10*3/uL (ref 1.7–7.7)
Neutrophils Relative %: 67 %

## 2019-09-05 LAB — CBC
HCT: 44.9 % (ref 39.0–52.0)
Hemoglobin: 15 g/dL (ref 13.0–17.0)
MCH: 31.6 pg (ref 26.0–34.0)
MCHC: 33.4 g/dL (ref 30.0–36.0)
MCV: 94.5 fL (ref 80.0–100.0)
Platelets: 205 10*3/uL (ref 150–400)
RBC: 4.75 MIL/uL (ref 4.22–5.81)
RDW: 11.9 % (ref 11.5–15.5)
WBC: 6.4 10*3/uL (ref 4.0–10.5)
nRBC: 0 % (ref 0.0–0.2)

## 2019-09-05 LAB — COMPREHENSIVE METABOLIC PANEL
ALT: 26 U/L (ref 0–44)
AST: 22 U/L (ref 15–41)
Albumin: 4.3 g/dL (ref 3.5–5.0)
Alkaline Phosphatase: 42 U/L (ref 38–126)
Anion gap: 10 (ref 5–15)
BUN: 12 mg/dL (ref 8–23)
CO2: 24 mmol/L (ref 22–32)
Calcium: 9.2 mg/dL (ref 8.9–10.3)
Chloride: 105 mmol/L (ref 98–111)
Creatinine, Ser: 0.68 mg/dL (ref 0.61–1.24)
GFR calc Af Amer: >60 mL/min (ref >60)
GFR calc non Af Amer: >60 mL/min (ref >60)
Glucose, Bld: 170 mg/dL — ABNORMAL HIGH (ref 70–99)
Potassium: 4 mmol/L (ref 3.5–5.1)
Sodium: 139 mmol/L (ref 135–145)
Total Bilirubin: 0.9 mg/dL (ref 0.3–1.2)
Total Protein: 7 g/dL (ref 6.5–8.1)

## 2019-09-05 LAB — CBG MONITORING, ED: Glucose-Capillary: 139 mg/dL — ABNORMAL HIGH (ref 70–99)

## 2019-09-05 LAB — PROTIME-INR
INR: 0.9 (ref 0.8–1.2)
Prothrombin Time: 12.5 seconds (ref 11.4–15.2)

## 2019-09-05 LAB — APTT: aPTT: 24 seconds (ref 24–36)

## 2019-09-05 MED ORDER — ACETAMINOPHEN 325 MG PO TABS
650.0000 mg | ORAL_TABLET | Freq: Four times a day (QID) | ORAL | Status: DC | PRN
  Administered 2019-09-05: 650 mg via ORAL
  Filled 2019-09-05: qty 2

## 2019-09-05 MED ORDER — SODIUM CHLORIDE 0.9 % IV BOLUS: 1000.0000 mL | Freq: Once | INTRAVENOUS | Status: DC

## 2019-09-05 MED ORDER — IOHEXOL 350 MG/ML SOLN
75.0000 mL | Freq: Once | INTRAVENOUS | Status: AC | PRN
  Administered 2019-09-05: 75 mL via INTRAVENOUS

## 2019-09-05 MED ORDER — MECLIZINE HCL 25 MG PO TABS
25.0000 mg | ORAL_TABLET | Freq: Once | ORAL | Status: AC
  Administered 2019-09-05: 25 mg via ORAL
  Filled 2019-09-05: qty 1

## 2019-09-05 MED ORDER — GADOBUTROL 1 MMOL/ML IV SOLN
10.0000 mL | Freq: Once | INTRAVENOUS | Status: AC | PRN
  Administered 2019-09-05: 10 mL via INTRAVENOUS

## 2019-09-05 MED ORDER — MECLIZINE HCL 25 MG PO TABS: 25.0000 mg | ORAL_TABLET | Freq: Three times a day (TID) | ORAL | 0 refills | Status: DC | PRN

## 2019-09-05 MED ORDER — SODIUM CHLORIDE 0.9% FLUSH
3.0000 mL | Freq: Once | INTRAVENOUS | Status: AC
  Administered 2019-09-05: 3 mL via INTRAVENOUS

## 2019-09-05 MED ORDER — LORAZEPAM 1 MG PO TABS
1.0000 mg | ORAL_TABLET | Freq: Once | ORAL | Status: AC
  Administered 2019-09-05: 1 mg via ORAL
  Filled 2019-09-05: qty 1

## 2019-09-05 NOTE — ED Notes (Signed)
Called and notified wife that she is able to come and visit pt.

## 2019-09-05 NOTE — Consult Note (Signed)
Neurology Consultation  Reason for Consult: Dizziness Referring Physician: Messick  CC: Postural dizziness  History is obtained from: Patient  HPI: Robert Mooney is a 69 y.o. male with history of hypertension, high cholesterol, diabetes.  Patient states that he went to sleep last night at approximately 9 PM and he felt normal.  Patient woke up early in the morning and suddenly felt as though he was "woozy headed "and came to the hospital for fear he may have had a stroke.  Neurology was consulted for possibility of stroke and dizziness.   Patient states he went to bed around 9:00 at night and at that time he felt normal.  He got up at approximately 2 AM to go to the bathroom.  Upon sitting up in bed he felt "woozy headed".  He denies having the room spinning, tunnel vision or diaphoresis at this time.  He got to the bathroom by holding onto objects and then went back to his bed.  Later in the morning he got back up and felt the same symptoms however at this time he slowly got onto his knees and laid on the floor.  He noted he was diaphoretic at that time.  He is diabetic but did not check his blood sugar.  His wife did give him a small piece of chocolate bar at that time.  EMS was called by his wife and patient was brought to the emergency department where he received a MRI and CTA of head and neck.  Currently he states he is still having that "woozy" sensation when he sits up or stands up but it resolves when laying down.  Moving his head from left to right or turning from side to side in the bed does not cause the sensation.  ED course  Relevant labs include -blood sugar 139, CT head shows-no CT evidence of intracranial pathology  Chart review-patient has seen Dr. Lavell Anchors back in 2016 at which time he also complained of symptom of dizziness only when standing.  At that time due to his dizziness, imbalance and neck pain with radicular symptoms Dr. Lavell Anchors did offer patient an MRI of the brain, MRI  of cervical spine and EMG nerve conduction study however the patient declined at that time.  Dr. Lavell Anchors also suggested primary care team to obtain a TSH, B12 folate and serum IFE and SPEP   Past Medical History:  Diagnosis Date  . Anxiety   . Diabetes ([place])   . High cholesterol   . Hypertension      Family History  Problem Relation Age of Onset  . Neuropathy Neg Hx    Social History:   reports that he has never smoked. He has never used smokeless tobacco. He reports that he does not drink alcohol or use drugs.  Medications  Current Facility-Administered Medications:  .  acetaminophen (TYLENOL) tablet 650 mg, 650 mg, Oral, Q6H PRN, Fransico Meadow, PA-C, 650 mg at 09/05/19 1156  Current Outpatient Medications:  .  ALPRAZolam (XANAX XR) 0.5 MG 24 hr tablet, Take 0.25 mg by mouth once. Wife's RX , Disp: , Rfl:  .  gabapentin (NEURONTIN) 600 MG tablet, Take 300 mg by mouth 2 (two) times daily. 1/2 tablet (300mg ) twice daily, Disp: , Rfl:  .  Garlic 123XX123 MG CAPS, Take 1,000 mg by mouth daily., Disp: , Rfl:  .  losartan (COZAAR) 50 MG tablet, Take 50 mg by mouth daily., Disp: , Rfl:  .  metFORMIN (GLUCOPHAGE) 500 MG tablet,  Take 500 mg by mouth 2 (two) times daily., Disp: , Rfl: 1 .  naproxen sodium (ANAPROX) 220 MG tablet, Take 220 mg by mouth 2 (two) times daily with a meal. , Disp: , Rfl:  .  Omega-3 Fatty Acids (FISH OIL PO), Take 2 capsules by mouth daily. 1000, Disp: , Rfl:  .  ACCU-CHEK AVIVA PLUS test strip, TEST BLOOD SUGAR ONCE A DAY, Disp: , Rfl: 11 .  pregabalin (LYRICA) 50 MG capsule, Take 1 capsule (50 mg total) by mouth 2 (two) times daily. (Patient not taking: Reported on 09/05/2019), Disp: 60 capsule, Rfl: 2  ROS:     General ROS: Positive for -  weight loss Psychological ROS: negative for - behavioral disorder, hallucinations, memory difficulties, mood swings or suicidal ideation Ophthalmic ROS: negative for - blurry vision, double vision, eye pain or loss of  vision ENT ROS: negative for - epistaxis, nasal discharge, oral lesions, sore throat, tinnitus or vertigo Allergy and Immunology ROS: negative for - hives or itchy/watery eyes Hematological and Lymphatic ROS: negative for - bleeding problems, bruising or swollen lymph nodes Endocrine ROS: negative for - galactorrhea, hair pattern changes, polydipsia/polyuria or temperature intolerance Respiratory ROS: negative for - cough, hemoptysis, shortness of breath or wheezing Cardiovascular ROS: negative for - chest pain, dyspnea on exertion, edema or irregular heartbeat Gastrointestinal ROS: negative for - abdominal pain, diarrhea, hematemesis, nausea/vomiting or stool incontinence Genito-Urinary ROS: negative for - dysuria, hematuria, incontinence or urinary frequency/urgency Musculoskeletal ROS: negative for - joint swelling or muscular weakness Neurological ROS: as noted in HPI Dermatological ROS: negative for rash and skin lesion changes  Exam: Current vital signs: BP (!) 159/76 (BP Location: Right Arm)   Pulse 75   Temp 98.1 F (36.7 C) (Oral)   Resp 18   Ht 5\' 8"  (1.727 m)   Wt 107 kg   SpO2 98%   BMI 35.88 kg/m  Vital signs in last 24 hours: Temp:  [98.1 F (36.7 C)] 98.1 F (36.7 C) (03/17 1002) Pulse Rate:  [75] 75 (03/17 1002) Resp:  [18] 18 (03/17 1002) BP: (159)/(76) 159/76 (03/17 1002) SpO2:  [98 %] 98 % (03/17 1002) Weight:  [107 kg] 107 kg (03/17 1002)   Constitutional: Appears well-developed and well-nourished.  Psych: Affect appropriate to situation Eyes: No scleral injection HENT: No OP obstrucion Head: Normocephalic.  Cardiovascular: Normal rate and regular rhythm.  Respiratory: Effort normal, non-labored breathing GI: Soft.  distension. There is no tenderness.  Skin: WDI  Neuro: Mental Status: Patient is awake, alert, oriented to person, place, month, year, and situation. Speech-intact for naming, repeating, comprehension Patient is able to give a clear  and coherent history. Cranial Nerves: II: Visual Fields are full.  III,IV, VI: EOMI without ptosis or diploplia. Pupils equal, round and reactive to light V: Facial sensation is symmetric to temperature VII: Facial movement is symmetric.  VIII: hearing is intact to voice X: Palat elevates symmetrically XI: Shoulder shrug is symmetric. XII: tongue is midline without atrophy or fasciculations.  Motor: Tone is normal. Bulk is normal. 5/5 strength was present in all four extremities.  Sensory: Sensation is symmetric to light touch and temperature in the arms and legs. Deep Tendon Reflexes: 2+ and symmetric in the biceps and patellae.  Plantars: Toes are downgoing bilaterally.  Cerebellar: FNF and HKS are intact bilaterally  Labs I have reviewed labs in epic and the results pertinent to this consultation are:   CBC    Component Value Date/Time   WBC  6.4 09/05/2019 1015   RBC 4.75 09/05/2019 1015   HGB 15.0 09/05/2019 1015   HCT 44.9 09/05/2019 1015   PLT 205 09/05/2019 1015   MCV 94.5 09/05/2019 1015   MCH 31.6 09/05/2019 1015   MCHC 33.4 09/05/2019 1015   RDW 11.9 09/05/2019 1015   LYMPHSABS 1.2 09/05/2019 1015   MONOABS 0.7 09/05/2019 1015   EOSABS 0.1 09/05/2019 1015   BASOSABS 0.1 09/05/2019 1015    CMP     Component Value Date/Time   NA 139 09/05/2019 1015   K 4.0 09/05/2019 1015   CL 105 09/05/2019 1015   CO2 24 09/05/2019 1015   GLUCOSE 170 (H) 09/05/2019 1015   BUN 12 09/05/2019 1015   CREATININE 0.68 09/05/2019 1015   CALCIUM 9.2 09/05/2019 1015   PROT 7.0 09/05/2019 1015   ALBUMIN 4.3 09/05/2019 1015   AST 22 09/05/2019 1015   ALT 26 09/05/2019 1015   ALKPHOS 42 09/05/2019 1015   BILITOT 0.9 09/05/2019 1015   GFRNONAA >60 09/05/2019 1015   GFRAA >60 09/05/2019 1015    Lipid Panel  No results found for: CHOL, TRIG, HDL, CHOLHDL, VLDL, LDLCALC, LDLDIRECT   Imaging I have reviewed the images obtained:  CT-scan of the brain-no CT evidence of  intracranial pathology  MRI examination of the brain-no evidence of acute intracranial abnormality.  Mild generalized parenchymal atrophy.  CTA head neck-no large vessel occlusion, hemodynamically significant stenosis or evidence of dissection  Robert Quill PA-C [facility] [phone]  M-F  (9:00 am- 5:00 PM)  09/05/2019, 1:45 PM    Assessment:  69 year old male presenting to the hospital secondary to new onset of dizziness/"woozy headed feeling".  MRI negative, CT head negative, CTA head neck shows no large vessel occlusion.  Neurological exam is nonlocalizing or lateralizing.  Patient does continue to have the same symptoms when sitting up or standing.  I believe likely patient is suffering from orthostatic blood pressures based on his symptoms, though really they are rather non-specific.   Impression: -Dizziness/"woozy headed sensation"    Recommendations: -Orthostatic blood pressures - consider PT referral if symptoms persists.   Roland Rack, MD [facility] [phone]  If 7pm- 7am, please page neurology on call as listed in [place].

## 2019-09-05 NOTE — ED Triage Notes (Addendum)
Pt to triage via [facility].  Pt states he woke up with severe dizziness this morning.  Felt fine when he went to bed last night.  EMS states wife says he normally has a slight L sided facial droop.  No arm drift.  Pt reports generalized weakness a few days ago after he started a no carb, no sugar diet.  States he is still dieting.

## 2019-09-05 NOTE — Discharge Instructions (Signed)
Return if any problems.

## 2019-09-05 NOTE — ED Notes (Signed)
Pt to MRI

## 2019-09-05 NOTE — ED Provider Notes (Signed)
[facility] EMERGENCY DEPARTMENT Provider Note   CSN: [id number] Arrival date & time: 09/05/19  [id number]     History Chief Complaint  Patient presents with  . Dizziness  . Weakness    Robert Mooney is a 69 y.o. male.  The history is provided by the patient. No language interpreter was used.  Dizziness Quality:  Lightheadedness and vertigo Severity:  Severe Onset quality:  Sudden Duration:  1 day Timing:  Constant Progression:  Unchanged Chronicity:  New Relieved by:  Nothing Worsened by:  Nothing Ineffective treatments:  None tried Associated symptoms: weakness   Risk factors: no new medications   Weakness Associated symptoms: dizziness   Pt reports he was normal when he went to be last night.  Pt reports this am when he sat up he was dizzy. Pt reports something is wrong in his head.  Pt states he can stand but he has to hold on to something.  Pt reports his cholesterol is high and he has been trying to eat better.       Past Medical History:  Diagnosis Date  . Anxiety   . Diabetes ([place])   . High cholesterol   . Hypertension     Patient Active Problem List   Diagnosis Date Noted  . Hereditary and idiopathic peripheral neuropathy 04/11/2015  . Diabetes ([place]) 04/11/2015  . Chronic low back pain with bilateral sciatica 04/11/2015  . Spinal stenosis of lumbar region 04/11/2015    Past Surgical History:  Procedure Laterality Date  . ROTATOR CUFF REPAIR Right 2008       Family History  Problem Relation Age of Onset  . Neuropathy Neg Hx     Social History   Tobacco Use  . Smoking status: Never Smoker  . Smokeless tobacco: Never Used  Substance Use Topics  . Alcohol use: No    Alcohol/week: 0.0 standard drinks  . Drug use: No    Home Medications Prior to Admission medications   Medication Sig Start Date End Date Taking? Authorizing Provider  ACCU-CHEK AVIVA PLUS test strip TEST BLOOD SUGAR ONCE A DAY 03/31/15   [provider]  Acetaminophen (TYLENOL EXTRA STRENGTH PO) Take 1 tablet by mouth as needed.     [provider]  lisinopril (PRINIVIL,ZESTRIL) 5 MG tablet Take 5 mg by mouth daily. 01/15/15   [provider]  metFORMIN (GLUCOPHAGE) 500 MG tablet Take 500 mg by mouth 2 (two) times daily. 01/21/15   [provider]  naproxen sodium (ANAPROX) 220 MG tablet Take 220 mg by mouth 2 (two) times daily with a meal.     [provider]  Omega-3 Fatty Acids (FISH OIL PO) Take 2 capsules by mouth daily. 1000    [provider]  pregabalin (LYRICA) 50 MG capsule Take 1 capsule (50 mg total) by mouth 2 (two) times daily. 06/09/15   Melvenia Beam, MD    Allergies    Patient has no known allergies.  Review of Systems   Review of Systems  Neurological: Positive for dizziness and weakness.  All other systems reviewed and are negative.   Physical Exam Updated Vital Signs BP (!) 159/76 (BP Location: Right Arm)   Pulse 75   Temp 98.1 F (36.7 C) (Oral)   Resp 18   Ht 5\' 8"  (1.727 m)   Wt 107 kg   SpO2 98%   BMI 35.88 kg/m   Physical Exam Vitals and nursing note reviewed.  Constitutional:  Appearance: He is well-developed.  HENT:     Head: Normocephalic and atraumatic.     Nose: Nose normal.     Mouth/Throat:     Mouth: Mucous membranes are moist.  Eyes:     Conjunctiva/sclera: Conjunctivae normal.  Cardiovascular:     Rate and Rhythm: Normal rate and regular rhythm.     Heart sounds: No murmur.  Pulmonary:     Effort: Pulmonary effort is normal. No respiratory distress.     Breath sounds: Normal breath sounds.  Abdominal:     Palpations: Abdomen is soft.     Tenderness: There is no abdominal tenderness.  Musculoskeletal:        General: Normal range of motion.     Cervical back: Neck supple.  Skin:    General: Skin is warm and dry.  Neurological:     General: No focal deficit present.     Mental Status: He is alert and oriented to  person, place, and time.  Psychiatric:        Mood and Affect: Mood normal.     ED Results / Procedures / Treatments   Labs (all labs ordered are listed, but only abnormal results are displayed) Labs Reviewed  PROTIME-INR  APTT  CBC  DIFFERENTIAL  COMPREHENSIVE METABOLIC PANEL    EKG EKG Interpretation  Date/Time:  Wednesday September 05 2019 09:56:00 EDT Ventricular Rate:  74 PR Interval:  184 QRS Duration: 90 QT Interval:  408 QTC Calculation: 452 R Axis:   16 Text Interpretation: Normal sinus rhythm Normal ECG Confirmed by Dene Gentry [phone]) on 09/05/2019 10:48:25 AM   Radiology CT HEAD WO CONTRAST  Result Date: 09/05/2019 CLINICAL DATA:  Dizziness with near syncopal episode. EXAM: CT HEAD WITHOUT CONTRAST TECHNIQUE: Contiguous axial images were obtained from the base of the skull through the vertex without intravenous contrast. COMPARISON:  None. FINDINGS: Brain: No evidence of acute infarction, hemorrhage, hydrocephalus, extra-axial collection or mass lesion/mass effect. Vascular: No hyperdense vessel or unexpected calcification. Skull: Normal. Negative for fracture or focal lesion. Sinuses/Orbits: Visualized sinuses and orbits are unremarkable. Other: None IMPRESSION: No CT evidence of acute intracranial pathology. Electronically Signed   By: Zetta Bills M.D.   On: 09/05/2019 11:05    Procedures Procedures (including critical care time)  Medications Ordered in ED Medications  sodium chloride flush (NS) 0.9 % injection 3 mL (has no administration in time range)    ED Course  I have reviewed the triage vital signs and the nursing notes.  Pertinent labs & imaging results that were available during my care of the patient were reviewed by me and considered in my medical decision making (see chart for details).    MDM Rules/Calculators/A&P                      MDM:  Ct no acute. Neurology consulted.  Orthostatics normal.  Pt given Iv fluids and zofran.  Mri and  ct scans normal  Pt given rx for antivert.  Pt advised to follow up with his MD  Final Clinical Impression(s) / ED Diagnoses Final diagnoses:  Dizziness    Rx / DC Orders ED Discharge Orders         Ordered    meclizine (ANTIVERT) 25 MG tablet  3 times daily PRN     09/05/19 1536           Sidney Ace 09/05/19 1538    Valarie Merino, MD 09/08/19 1100

## 2020-06-30 DIAGNOSIS — M5459 Other low back pain: Secondary | ICD-10-CM | POA: Diagnosis not present

## 2020-06-30 DIAGNOSIS — G5603 Carpal tunnel syndrome, bilateral upper limbs: Secondary | ICD-10-CM | POA: Diagnosis not present

## 2020-06-30 DIAGNOSIS — M5416 Radiculopathy, lumbar region: Secondary | ICD-10-CM | POA: Diagnosis not present

## 2020-07-23 DIAGNOSIS — M545 Low back pain, unspecified: Secondary | ICD-10-CM | POA: Diagnosis not present

## 2020-07-31 DIAGNOSIS — M5416 Radiculopathy, lumbar region: Secondary | ICD-10-CM | POA: Diagnosis not present

## 2020-08-12 DIAGNOSIS — M5416 Radiculopathy, lumbar region: Secondary | ICD-10-CM | POA: Diagnosis not present

## 2020-08-26 DIAGNOSIS — M5416 Radiculopathy, lumbar region: Secondary | ICD-10-CM | POA: Diagnosis not present

## 2020-09-05 DIAGNOSIS — Z7984 Long term (current) use of oral hypoglycemic drugs: Secondary | ICD-10-CM | POA: Diagnosis not present

## 2020-09-05 DIAGNOSIS — K219 Gastro-esophageal reflux disease without esophagitis: Secondary | ICD-10-CM | POA: Diagnosis not present

## 2020-09-05 DIAGNOSIS — E119 Type 2 diabetes mellitus without complications: Secondary | ICD-10-CM | POA: Diagnosis not present

## 2020-09-05 DIAGNOSIS — M791 Myalgia, unspecified site: Secondary | ICD-10-CM | POA: Diagnosis not present

## 2020-09-05 DIAGNOSIS — E785 Hyperlipidemia, unspecified: Secondary | ICD-10-CM | POA: Diagnosis not present

## 2020-09-05 DIAGNOSIS — I10 Essential (primary) hypertension: Secondary | ICD-10-CM | POA: Diagnosis not present

## 2020-09-05 DIAGNOSIS — Z0000 Encounter for general adult medical examination without abnormal findings: Secondary | ICD-10-CM | POA: Diagnosis not present

## 2020-09-05 DIAGNOSIS — E1169 Type 2 diabetes mellitus with other specified complication: Secondary | ICD-10-CM | POA: Diagnosis not present

## 2020-09-05 DIAGNOSIS — Z1211 Encounter for screening for malignant neoplasm of colon: Secondary | ICD-10-CM | POA: Diagnosis not present

## 2020-09-05 DIAGNOSIS — T466X5A Adverse effect of antihyperlipidemic and antiarteriosclerotic drugs, initial encounter: Secondary | ICD-10-CM | POA: Diagnosis not present

## 2020-09-10 DIAGNOSIS — Z1211 Encounter for screening for malignant neoplasm of colon: Secondary | ICD-10-CM | POA: Diagnosis not present

## 2020-09-17 DIAGNOSIS — I10 Essential (primary) hypertension: Secondary | ICD-10-CM | POA: Diagnosis not present

## 2020-09-17 DIAGNOSIS — E1169 Type 2 diabetes mellitus with other specified complication: Secondary | ICD-10-CM | POA: Diagnosis not present

## 2020-09-17 DIAGNOSIS — E785 Hyperlipidemia, unspecified: Secondary | ICD-10-CM | POA: Diagnosis not present

## 2020-09-17 DIAGNOSIS — K219 Gastro-esophageal reflux disease without esophagitis: Secondary | ICD-10-CM | POA: Diagnosis not present

## 2020-09-17 DIAGNOSIS — E119 Type 2 diabetes mellitus without complications: Secondary | ICD-10-CM | POA: Diagnosis not present

## 2020-10-24 DIAGNOSIS — E119 Type 2 diabetes mellitus without complications: Secondary | ICD-10-CM | POA: Diagnosis not present

## 2020-10-24 DIAGNOSIS — H40033 Anatomical narrow angle, bilateral: Secondary | ICD-10-CM | POA: Diagnosis not present

## 2020-11-12 DIAGNOSIS — E1149 Type 2 diabetes mellitus with other diabetic neurological complication: Secondary | ICD-10-CM | POA: Diagnosis not present

## 2020-11-21 DIAGNOSIS — E785 Hyperlipidemia, unspecified: Secondary | ICD-10-CM | POA: Diagnosis not present

## 2020-11-21 DIAGNOSIS — K219 Gastro-esophageal reflux disease without esophagitis: Secondary | ICD-10-CM | POA: Diagnosis not present

## 2020-11-21 DIAGNOSIS — I10 Essential (primary) hypertension: Secondary | ICD-10-CM | POA: Diagnosis not present

## 2020-11-21 DIAGNOSIS — E119 Type 2 diabetes mellitus without complications: Secondary | ICD-10-CM | POA: Diagnosis not present

## 2020-11-21 DIAGNOSIS — E1169 Type 2 diabetes mellitus with other specified complication: Secondary | ICD-10-CM | POA: Diagnosis not present

## 2020-12-09 DIAGNOSIS — M5416 Radiculopathy, lumbar region: Secondary | ICD-10-CM | POA: Diagnosis not present

## 2021-01-07 DIAGNOSIS — M5136 Other intervertebral disc degeneration, lumbar region: Secondary | ICD-10-CM | POA: Diagnosis not present

## 2021-03-13 DIAGNOSIS — Z7984 Long term (current) use of oral hypoglycemic drugs: Secondary | ICD-10-CM | POA: Diagnosis not present

## 2021-03-13 DIAGNOSIS — E1169 Type 2 diabetes mellitus with other specified complication: Secondary | ICD-10-CM | POA: Diagnosis not present

## 2021-03-13 DIAGNOSIS — I10 Essential (primary) hypertension: Secondary | ICD-10-CM | POA: Diagnosis not present

## 2021-03-13 DIAGNOSIS — E785 Hyperlipidemia, unspecified: Secondary | ICD-10-CM | POA: Diagnosis not present

## 2021-03-14 DIAGNOSIS — K219 Gastro-esophageal reflux disease without esophagitis: Secondary | ICD-10-CM | POA: Diagnosis not present

## 2021-03-14 DIAGNOSIS — E119 Type 2 diabetes mellitus without complications: Secondary | ICD-10-CM | POA: Diagnosis not present

## 2021-03-14 DIAGNOSIS — E785 Hyperlipidemia, unspecified: Secondary | ICD-10-CM | POA: Diagnosis not present

## 2021-03-14 DIAGNOSIS — I10 Essential (primary) hypertension: Secondary | ICD-10-CM | POA: Diagnosis not present

## 2021-03-14 DIAGNOSIS — E1169 Type 2 diabetes mellitus with other specified complication: Secondary | ICD-10-CM | POA: Diagnosis not present

## 2021-03-25 IMAGING — CT CT ANGIO NECK
2 of 7 series · 8 of 33 positions shown · IV contrast (OMNI 350)
Comparison: None.

CLINICAL DATA: Dizziness

EXAM:
CT ANGIOGRAPHY HEAD AND NECK
TECHNIQUE: Multidetector CT imaging of the head and neck was performed using
the standard protocol during bolus administration of intravenous
contrast. Multiplanar CT image reconstructions and MIPs were
obtained to evaluate the vascular anatomy. Carotid stenosis
measurements (when applicable) are obtained utilizing NASCET
criteria, using the distal internal carotid diameter as the
denominator.
CONTRAST:  75mL OMNIPAQUE IOHEXOL 350 MG/ML SOLN

[Series 5: cta neck · axial · 0.45mm/px · z∈[-144,-30]mm · 2 of 171 slices shown]
[im 57/171  soft-tissue]
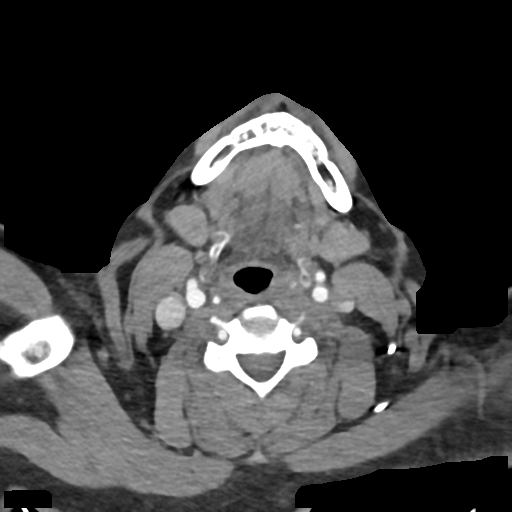
[im 114/171  bone]
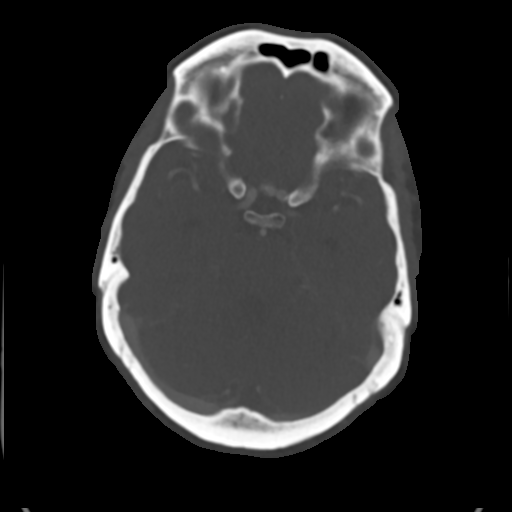

[Series 7: cta neck axial · axial · 0.39mm/px · z∈[-208,+34]mm · 6 of 340 slices shown]
[im 49/340  soft-tissue]
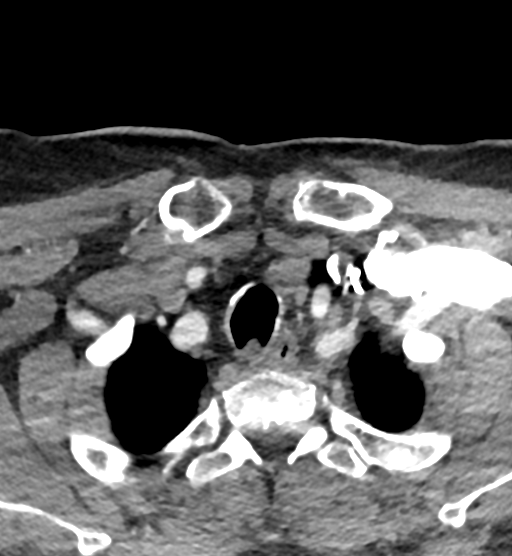
[im 97/340  soft-tissue]
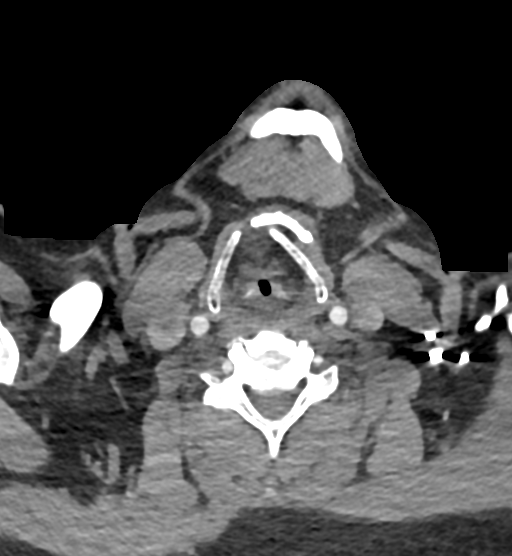
[im 146/340  soft-tissue]
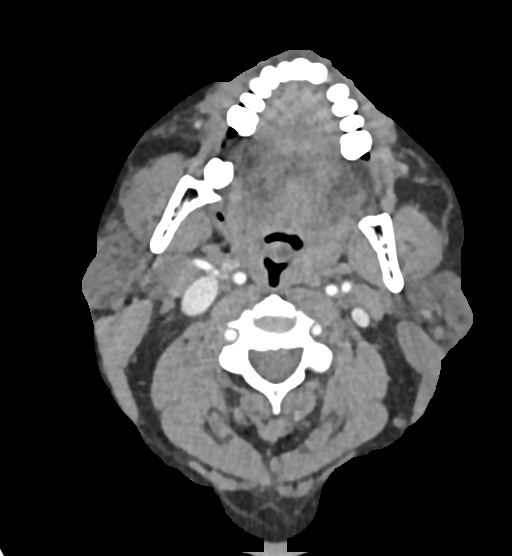
[im 194/340  soft-tissue]
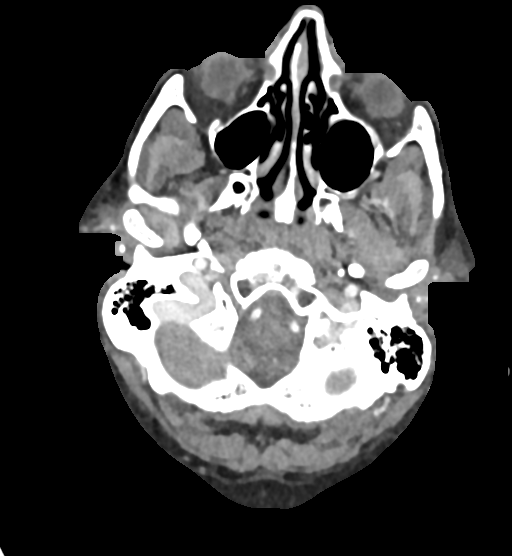
[im 243/340  soft-tissue]
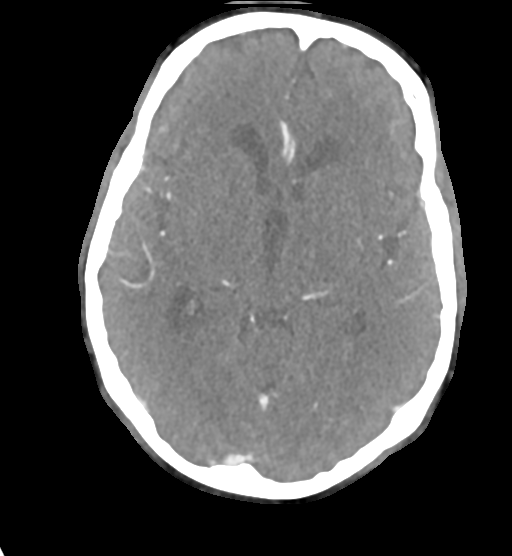
[im 291/340  soft-tissue]
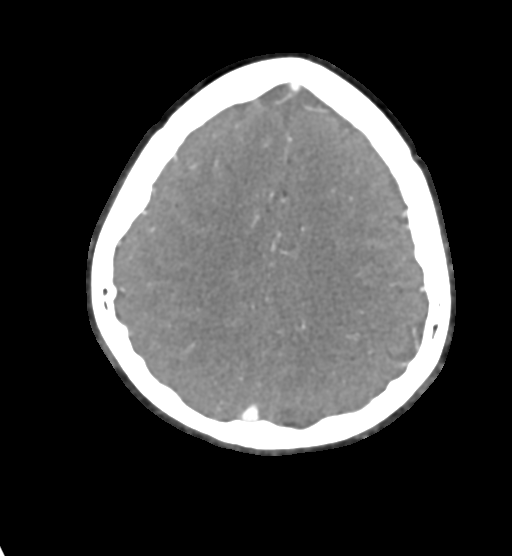

[8 of 33 positions shown; findings below may reference images not displayed]

FINDINGS: CTA NECK FINDINGS

Aortic arch: Great vessel origins are patent.

Right carotid system: Patent.  No measurable stenosis.

Left carotid system: Patent.  No measurable stenosis.

Vertebral arteries: Patent. No measurable stenosis or evidence of
dissection.

Skeleton: Mild cervical spine degenerative changes, greatest at
C5-C6.

Other neck: No mass or adenopathy.

Upper chest: No apical lung mass.

Review of the MIP images confirms the above findings

CTA HEAD FINDINGS

Anterior circulation: Intracranial internal carotid arteries patent
with mild calcified plaque. Anterior and middle cerebral arteries
are patent.

Posterior circulation: Intracranial vertebral arteries basilar
artery, and posterior cerebral arteries patent.

Venous sinuses: As permitted by contrast timing, patent.

Review of the MIP images confirms the above findings
IMPRESSION: No large vessel occlusion, hemodynamically significant stenosis, or
evidence of dissection.

## 2021-04-20 DIAGNOSIS — M25562 Pain in left knee: Secondary | ICD-10-CM | POA: Diagnosis not present

## 2021-05-22 ENCOUNTER — Ambulatory Visit: Payer: Medicare Other | Admitting: Family Medicine

## 2021-08-25 DIAGNOSIS — M5416 Radiculopathy, lumbar region: Secondary | ICD-10-CM | POA: Diagnosis not present

## 2021-08-27 DIAGNOSIS — M5416 Radiculopathy, lumbar region: Secondary | ICD-10-CM | POA: Diagnosis not present

## 2021-09-10 DIAGNOSIS — Z1211 Encounter for screening for malignant neoplasm of colon: Secondary | ICD-10-CM | POA: Diagnosis not present

## 2021-09-10 DIAGNOSIS — E1169 Type 2 diabetes mellitus with other specified complication: Secondary | ICD-10-CM | POA: Diagnosis not present

## 2021-09-10 DIAGNOSIS — G629 Polyneuropathy, unspecified: Secondary | ICD-10-CM | POA: Diagnosis not present

## 2021-09-10 DIAGNOSIS — Z0000 Encounter for general adult medical examination without abnormal findings: Secondary | ICD-10-CM | POA: Diagnosis not present

## 2021-09-10 DIAGNOSIS — I10 Essential (primary) hypertension: Secondary | ICD-10-CM | POA: Diagnosis not present

## 2021-09-10 DIAGNOSIS — E785 Hyperlipidemia, unspecified: Secondary | ICD-10-CM | POA: Diagnosis not present

## 2021-09-11 DIAGNOSIS — M5416 Radiculopathy, lumbar region: Secondary | ICD-10-CM | POA: Diagnosis not present

## 2021-09-11 DIAGNOSIS — M25551 Pain in right hip: Secondary | ICD-10-CM | POA: Diagnosis not present

## 2021-09-17 DIAGNOSIS — Z1211 Encounter for screening for malignant neoplasm of colon: Secondary | ICD-10-CM | POA: Diagnosis not present

## 2021-10-16 DIAGNOSIS — M7061 Trochanteric bursitis, right hip: Secondary | ICD-10-CM | POA: Diagnosis not present

## 2021-10-16 DIAGNOSIS — M25551 Pain in right hip: Secondary | ICD-10-CM | POA: Diagnosis not present

## 2021-10-29 DIAGNOSIS — L570 Actinic keratosis: Secondary | ICD-10-CM | POA: Diagnosis not present

## 2021-10-29 DIAGNOSIS — X32XXXD Exposure to sunlight, subsequent encounter: Secondary | ICD-10-CM | POA: Diagnosis not present

## 2021-12-29 ENCOUNTER — Other Ambulatory Visit: Payer: Self-pay | Admitting: Physical Medicine and Rehabilitation

## 2021-12-29 ENCOUNTER — Other Ambulatory Visit: Payer: Self-pay

## 2021-12-29 DIAGNOSIS — M5416 Radiculopathy, lumbar region: Secondary | ICD-10-CM

## 2021-12-31 ENCOUNTER — Other Ambulatory Visit: Payer: Medicare Other

## 2022-01-21 DIAGNOSIS — M5416 Radiculopathy, lumbar region: Secondary | ICD-10-CM | POA: Diagnosis not present

## 2022-01-27 ENCOUNTER — Other Ambulatory Visit: Payer: Self-pay

## 2022-01-27 ENCOUNTER — Encounter (HOSPITAL_COMMUNITY): Payer: Self-pay | Admitting: Emergency Medicine

## 2022-01-27 ENCOUNTER — Ambulatory Visit (HOSPITAL_COMMUNITY)
Admission: EM | Disposition: A | Discharge status: Home / Self Care | Payer: Self-pay | Source: Home / Self Care | Attending: Emergency Medicine

## 2022-01-27 ENCOUNTER — Observation Stay (HOSPITAL_COMMUNITY)
Admission: EM | Admit: 2022-01-27 | Discharge: 2022-01-28 | Disposition: A | Discharge status: Home / Self Care | Payer: Medicare Other | Attending: Cardiovascular Disease | Admitting: Cardiovascular Disease

## 2022-01-27 ENCOUNTER — Emergency Department (HOSPITAL_COMMUNITY): Payer: Medicare Other

## 2022-01-27 DIAGNOSIS — Z7984 Long term (current) use of oral hypoglycemic drugs: Secondary | ICD-10-CM | POA: Insufficient documentation

## 2022-01-27 DIAGNOSIS — Z79899 Other long term (current) drug therapy: Secondary | ICD-10-CM | POA: Insufficient documentation

## 2022-01-27 DIAGNOSIS — I214 Non-ST elevation (NSTEMI) myocardial infarction: Secondary | ICD-10-CM | POA: Diagnosis not present

## 2022-01-27 DIAGNOSIS — E119 Type 2 diabetes mellitus without complications: Secondary | ICD-10-CM | POA: Insufficient documentation

## 2022-01-27 DIAGNOSIS — I10 Essential (primary) hypertension: Secondary | ICD-10-CM | POA: Diagnosis not present

## 2022-01-27 DIAGNOSIS — E1169 Type 2 diabetes mellitus with other specified complication: Secondary | ICD-10-CM

## 2022-01-27 DIAGNOSIS — I2510 Atherosclerotic heart disease of native coronary artery without angina pectoris: Secondary | ICD-10-CM | POA: Diagnosis not present

## 2022-01-27 DIAGNOSIS — Z743 Need for continuous supervision: Secondary | ICD-10-CM | POA: Diagnosis not present

## 2022-01-27 DIAGNOSIS — R0789 Other chest pain: Secondary | ICD-10-CM | POA: Diagnosis present

## 2022-01-27 DIAGNOSIS — Z955 Presence of coronary angioplasty implant and graft: Secondary | ICD-10-CM

## 2022-01-27 DIAGNOSIS — E782 Mixed hyperlipidemia: Secondary | ICD-10-CM | POA: Diagnosis not present

## 2022-01-27 DIAGNOSIS — E669 Obesity, unspecified: Secondary | ICD-10-CM

## 2022-01-27 DIAGNOSIS — E7849 Other hyperlipidemia: Secondary | ICD-10-CM

## 2022-01-27 DIAGNOSIS — E785 Hyperlipidemia, unspecified: Secondary | ICD-10-CM

## 2022-01-27 DIAGNOSIS — R0689 Other abnormalities of breathing: Secondary | ICD-10-CM | POA: Diagnosis not present

## 2022-01-27 DIAGNOSIS — R6889 Other general symptoms and signs: Secondary | ICD-10-CM | POA: Diagnosis not present

## 2022-01-27 DIAGNOSIS — R079 Chest pain, unspecified: Secondary | ICD-10-CM | POA: Diagnosis not present

## 2022-01-27 HISTORY — PX: CORONARY STENT INTERVENTION: CATH118234

## 2022-01-27 HISTORY — PX: LEFT HEART CATH AND CORONARY ANGIOGRAPHY: CATH118249

## 2022-01-27 LAB — GLUCOSE, CAPILLARY: Glucose-Capillary: 163 mg/dL — ABNORMAL HIGH (ref 70–99)

## 2022-01-27 LAB — BASIC METABOLIC PANEL
Anion gap: 7 (ref 5–15)
BUN: 14 mg/dL (ref 8–23)
CO2: 24 mmol/L (ref 22–32)
Calcium: 9.6 mg/dL (ref 8.9–10.3)
Chloride: 106 mmol/L (ref 98–111)
Creatinine, Ser: 0.64 mg/dL (ref 0.61–1.24)
GFR, Estimated: >60 mL/min (ref >60)
Glucose, Bld: 131 mg/dL — ABNORMAL HIGH (ref 70–99)
Potassium: 4.2 mmol/L (ref 3.5–5.1)
Sodium: 137 mmol/L (ref 135–145)

## 2022-01-27 LAB — CBC
HCT: 44.5 % (ref 39.0–52.0)
Hemoglobin: 15 g/dL (ref 13.0–17.0)
MCH: 31.3 pg (ref 26.0–34.0)
MCHC: 33.7 g/dL (ref 30.0–36.0)
MCV: 92.9 fL (ref 80.0–100.0)
Platelets: 232 10*3/uL (ref 150–400)
RBC: 4.79 MIL/uL (ref 4.22–5.81)
RDW: 12.2 % (ref 11.5–15.5)
WBC: 7.6 10*3/uL (ref 4.0–10.5)
nRBC: 0 % (ref 0.0–0.2)

## 2022-01-27 LAB — TROPONIN I (HIGH SENSITIVITY)
Troponin I (High Sensitivity): 370 ng/L (ref <18)
Troponin I (High Sensitivity): 417 ng/L (ref <18)

## 2022-01-27 SURGERY — LEFT HEART CATH AND CORONARY ANGIOGRAPHY
Anesthesia: LOCAL

## 2022-01-27 MED ORDER — MORPHINE SULFATE (PF) 4 MG/ML IV SOLN
2.0000 mg | Freq: Once | INTRAVENOUS | Status: AC
  Administered 2022-01-27: 2 mg via INTRAVENOUS
  Filled 2022-01-27: qty 1

## 2022-01-27 MED ORDER — NITROGLYCERIN 1 MG/10 ML FOR IR/CATH LAB
INTRA_ARTERIAL | Status: AC
  Filled 2022-01-27: qty 10

## 2022-01-27 MED ORDER — HEPARIN SODIUM (PORCINE) 1000 UNIT/ML IJ SOLN
INTRAMUSCULAR | Status: AC
  Filled 2022-01-27: qty 10

## 2022-01-27 MED ORDER — TICAGRELOR 90 MG PO TABS
ORAL_TABLET | ORAL | Status: AC
  Filled 2022-01-27: qty 2

## 2022-01-27 MED ORDER — HYDRALAZINE HCL 20 MG/ML IJ SOLN: 10.0000 mg | INTRAMUSCULAR | Status: AC | PRN

## 2022-01-27 MED ORDER — MIDAZOLAM HCL 2 MG/2ML IJ SOLN
INTRAMUSCULAR | Status: DC | PRN
  Administered 2022-01-27: 2 mg via INTRAVENOUS

## 2022-01-27 MED ORDER — HEPARIN (PORCINE) IN NACL 1000-0.9 UT/500ML-% IV SOLN
INTRAVENOUS | Status: AC
  Filled 2022-01-27: qty 1000

## 2022-01-27 MED ORDER — VERAPAMIL HCL 2.5 MG/ML IV SOLN
INTRAVENOUS | Status: AC
  Filled 2022-01-27: qty 2

## 2022-01-27 MED ORDER — ALUM & MAG HYDROXIDE-SIMETH 200-200-20 MG/5ML PO SUSP
30.0000 mL | Freq: Once | ORAL | Status: AC
  Administered 2022-01-27: 30 mL via ORAL
  Filled 2022-01-27: qty 30

## 2022-01-27 MED ORDER — NITROGLYCERIN 0.4 MG SL SUBL
0.4000 mg | SUBLINGUAL_TABLET | SUBLINGUAL | Status: DC | PRN
  Administered 2022-01-27: 0.4 mg via SUBLINGUAL
  Filled 2022-01-27: qty 1

## 2022-01-27 MED ORDER — TICAGRELOR 90 MG PO TABS
90.0000 mg | ORAL_TABLET | Freq: Two times a day (BID) | ORAL | Status: DC
  Administered 2022-01-27 – 2022-01-28 (×2): 90 mg via ORAL
  Filled 2022-01-27 (×2): qty 1

## 2022-01-27 MED ORDER — ASPIRIN 81 MG PO CHEW: 81.0000 mg | CHEWABLE_TABLET | Freq: Once | ORAL | Status: DC

## 2022-01-27 MED ORDER — ACETAMINOPHEN 325 MG PO TABS
650.0000 mg | ORAL_TABLET | ORAL | Status: DC | PRN
  Administered 2022-01-27 (×2): 650 mg via ORAL
  Filled 2022-01-27 (×2): qty 2

## 2022-01-27 MED ORDER — SODIUM CHLORIDE 0.9% FLUSH: 3.0000 mL | INTRAVENOUS | Status: DC | PRN

## 2022-01-27 MED ORDER — IOHEXOL 350 MG/ML SOLN
INTRAVENOUS | Status: DC | PRN
  Administered 2022-01-27: 140 mL

## 2022-01-27 MED ORDER — LOSARTAN POTASSIUM 50 MG PO TABS
50.0000 mg | ORAL_TABLET | Freq: Every day | ORAL | Status: DC
  Administered 2022-01-28: 50 mg via ORAL
  Filled 2022-01-27: qty 1

## 2022-01-27 MED ORDER — ATORVASTATIN CALCIUM 80 MG PO TABS
80.0000 mg | ORAL_TABLET | Freq: Every day | ORAL | Status: DC
  Administered 2022-01-27: 80 mg via ORAL
  Filled 2022-01-27: qty 1

## 2022-01-27 MED ORDER — ASPIRIN 81 MG PO TBEC
81.0000 mg | DELAYED_RELEASE_TABLET | Freq: Every day | ORAL | Status: DC
  Administered 2022-01-28: 81 mg via ORAL
  Filled 2022-01-27: qty 1

## 2022-01-27 MED ORDER — HEPARIN SODIUM (PORCINE) 1000 UNIT/ML IJ SOLN
INTRAMUSCULAR | Status: DC | PRN
  Administered 2022-01-27 (×2): 6000 [IU] via INTRAVENOUS

## 2022-01-27 MED ORDER — LABETALOL HCL 5 MG/ML IV SOLN: 10.0000 mg | INTRAVENOUS | Status: AC | PRN

## 2022-01-27 MED ORDER — SODIUM CHLORIDE 0.9 % IV SOLN: INTRAVENOUS | Status: AC

## 2022-01-27 MED ORDER — SODIUM CHLORIDE 0.9 % IV SOLN
250.0000 mL | INTRAVENOUS | Status: DC | PRN
Start: 2022-01-27 — End: 2022-01-28

## 2022-01-27 MED ORDER — ASPIRIN 325 MG PO TBEC: 325.0000 mg | DELAYED_RELEASE_TABLET | Freq: Once | ORAL | Status: DC

## 2022-01-27 MED ORDER — FENTANYL CITRATE (PF) 100 MCG/2ML IJ SOLN
INTRAMUSCULAR | Status: AC
  Filled 2022-01-27: qty 2

## 2022-01-27 MED ORDER — TICAGRELOR 90 MG PO TABS
ORAL_TABLET | ORAL | Status: DC | PRN
  Administered 2022-01-27: 180 mg via ORAL

## 2022-01-27 MED ORDER — HEPARIN (PORCINE) 25000 UT/250ML-% IV SOLN
INTRAVENOUS | Status: AC
  Administered 2022-01-27: 1150 [IU]/h via INTRAVENOUS
  Filled 2022-01-27: qty 250

## 2022-01-27 MED ORDER — GABAPENTIN 600 MG PO TABS
300.0000 mg | ORAL_TABLET | Freq: Two times a day (BID) | ORAL | Status: DC
  Administered 2022-01-28: 300 mg via ORAL
  Filled 2022-01-27: qty 1

## 2022-01-27 MED ORDER — HEPARIN (PORCINE) IN NACL 1000-0.9 UT/500ML-% IV SOLN
INTRAVENOUS | Status: DC | PRN
  Administered 2022-01-27 (×3): 500 mL

## 2022-01-27 MED ORDER — LIDOCAINE HCL (PF) 1 % IJ SOLN
INTRAMUSCULAR | Status: DC | PRN
  Administered 2022-01-27: 5 mL via INTRADERMAL

## 2022-01-27 MED ORDER — FENTANYL CITRATE (PF) 100 MCG/2ML IJ SOLN
INTRAMUSCULAR | Status: DC | PRN
  Administered 2022-01-27: 50 ug via INTRAVENOUS

## 2022-01-27 MED ORDER — LIDOCAINE HCL (PF) 1 % IJ SOLN
INTRAMUSCULAR | Status: AC
  Filled 2022-01-27: qty 30

## 2022-01-27 MED ORDER — METOPROLOL TARTRATE 25 MG PO TABS
25.0000 mg | ORAL_TABLET | Freq: Two times a day (BID) | ORAL | Status: DC
  Administered 2022-01-27 – 2022-01-28 (×3): 25 mg via ORAL
  Filled 2022-01-27 (×3): qty 1

## 2022-01-27 MED ORDER — HEPARIN (PORCINE) 25000 UT/250ML-% IV SOLN
1150.0000 [IU]/h | INTRAVENOUS | Status: DC
  Filled 2022-01-27: qty 250

## 2022-01-27 MED ORDER — HEPARIN SODIUM (PORCINE) 1000 UNIT/ML IJ SOLN
INTRAMUSCULAR | Status: AC
Start: 2022-01-27 — End: ?
  Filled 2022-01-27: qty 10

## 2022-01-27 MED ORDER — VERAPAMIL HCL 2.5 MG/ML IV SOLN
INTRAVENOUS | Status: DC | PRN
  Administered 2022-01-27: 10 mL via INTRA_ARTERIAL

## 2022-01-27 MED ORDER — HEPARIN BOLUS VIA INFUSION
4000.0000 [IU] | Freq: Once | INTRAVENOUS | Status: AC
Start: 2022-01-27 — End: 2022-01-27
  Administered 2022-01-27: 4000 [IU] via INTRAVENOUS

## 2022-01-27 MED ORDER — ONDANSETRON HCL 4 MG/2ML IJ SOLN: 4.0000 mg | Freq: Four times a day (QID) | INTRAMUSCULAR | Status: DC | PRN

## 2022-01-27 MED ORDER — SODIUM CHLORIDE 0.9% FLUSH
3.0000 mL | Freq: Two times a day (BID) | INTRAVENOUS | Status: DC
  Administered 2022-01-27 – 2022-01-28 (×2): 3 mL via INTRAVENOUS

## 2022-01-27 MED ORDER — LIDOCAINE VISCOUS HCL 2 % MT SOLN
15.0000 mL | Freq: Once | OROMUCOSAL | Status: AC
  Administered 2022-01-27: 15 mL via ORAL
  Filled 2022-01-27: qty 15

## 2022-01-27 MED ORDER — SODIUM CHLORIDE 0.9 % IV SOLN: INTRAVENOUS | Status: DC

## 2022-01-27 MED ORDER — MIDAZOLAM HCL 2 MG/2ML IJ SOLN
INTRAMUSCULAR | Status: AC
  Filled 2022-01-27: qty 2

## 2022-01-27 SURGICAL SUPPLY — 19 items
BALL SAPPHIRE NC24 3.0X15 (BALLOONS) ×2
BALLN SAPPHIRE 2.0X12 (BALLOONS) ×2
BALLOON SAPPHIRE 2.0X12 (BALLOONS) IMPLANT
BALLOON SAPPHIRE NC24 3.0X15 (BALLOONS) IMPLANT
CATH 5FR JL3.5 JR4 ANG PIG MP (CATHETERS) ×1 IMPLANT
CATH VISTA GUIDE 6FR XB3 (CATHETERS) ×1 IMPLANT
DEVICE RAD COMP TR BAND LRG (VASCULAR PRODUCTS) ×1 IMPLANT
GLIDESHEATH SLEND SS 6F .021 (SHEATH) ×1 IMPLANT
GUIDEWIRE INQWIRE 1.5J.035X260 (WIRE) IMPLANT
INQWIRE 1.5J .035X260CM (WIRE) ×2
KIT ENCORE 26 ADVANTAGE (KITS) ×1 IMPLANT
KIT HEART LEFT (KITS) ×2 IMPLANT
PACK CARDIAC CATHETERIZATION (CUSTOM PROCEDURE TRAY) ×2 IMPLANT
STENT SYNERGY XD 2.75X38 (Permanent Stent) IMPLANT
SYNERGY XD 2.75X38 (Permanent Stent) ×2 IMPLANT
SYR MEDRAD MARK 7 150ML (SYRINGE) ×2 IMPLANT
TRANSDUCER W/STOPCOCK (MISCELLANEOUS) ×2 IMPLANT
TUBING CIL FLEX 10 FLL-RA (TUBING) ×2 IMPLANT
WIRE COUGAR XT STRL 190CM (WIRE) ×1 IMPLANT

## 2022-01-27 NOTE — ED Provider Notes (Signed)
[facility] EMERGENCY DEPARTMENT Provider Note   CSN: [id number] Arrival date & time: 01/27/22  1010     History  Chief Complaint  Patient presents with   Chest Pain    Robert Mooney is a 71 y.o. male with past medical history significant for diabetes, anxiety, high cholesterol, hypertension who presents with concern for intermittent central to left-sided burning chest pain for the last week, and ongoing today.  Patient reports chest pain does not seem to have any associated triggers, but does seem to be exertional in nature.  Patient does report some worsening anxiety, shortness of breath associated with chest pain.  He reports that initially thought it may have been acid reflux-like symptoms, but denies improvement with Prilosec x 2.  No previous history of ACS, stroke. No strong family history.   Chest Pain Associated symptoms: shortness of breath        Home Medications Prior to Admission medications   Medication Sig Start Date End Date Taking? Authorizing Provider  ACCU-CHEK AVIVA PLUS test strip TEST BLOOD SUGAR ONCE A DAY 03/31/15   [provider]  ALPRAZolam (XANAX XR) 0.5 MG 24 hr tablet Take 0.25 mg by mouth once. Wife's RX     [provider]  gabapentin (NEURONTIN) 600 MG tablet Take 300 mg by mouth 2 (two) times daily. 1/2 tablet ('300mg'$ ) twice daily 06/26/19   [provider]  Garlic 2423 MG CAPS Take 1,000 mg by mouth daily.    [provider]  losartan (COZAAR) 50 MG tablet Take 50 mg by mouth daily. 06/26/19   [provider]  meclizine (ANTIVERT) 25 MG tablet Take 1 tablet (25 mg total) by mouth 3 (three) times daily as needed for dizziness. 09/05/19   Fransico Meadow, PA-C  metFORMIN (GLUCOPHAGE) 500 MG tablet Take 500 mg by mouth 2 (two) times daily. 01/21/15   [provider]  naproxen sodium (ANAPROX) 220 MG tablet Take 220 mg by mouth 2 (two) times daily with a meal.     [provider]  Omega-3 Fatty  Acids (FISH OIL PO) Take 2 capsules by mouth daily. 1000    [provider]  pregabalin (LYRICA) 50 MG capsule Take 1 capsule (50 mg total) by mouth 2 (two) times daily. Patient not taking: Reported on 09/05/2019 06/09/15   Melvenia Beam, MD      Allergies    Patient has no known allergies.    Review of Systems   Review of Systems  Respiratory:  Positive for shortness of breath.   Cardiovascular:  Positive for chest pain.  Psychiatric/Behavioral:  The patient is nervous/anxious.   All other systems reviewed and are negative.   Physical Exam Updated Vital Signs BP 131/66   Pulse 73   Temp 97.9 F (36.6 C) (Oral)   Resp 12   Ht '5\' 8"'$  (1.727 m)   Wt 107 kg   SpO2 94%   BMI 35.87 kg/m  Physical Exam Vitals and nursing note reviewed.  Constitutional:      General: He is not in acute distress.    Appearance: Normal appearance.  HENT:     Head: Normocephalic and atraumatic.  Eyes:     General:        Right eye: No discharge.        Left eye: No discharge.  Cardiovascular:     Rate and Rhythm: Normal rate and regular rhythm.     Heart sounds: No murmur heard.  No friction rub. No gallop.  Pulmonary:     Effort: Pulmonary effort is normal.     Breath sounds: Normal breath sounds.  Chest:     Comments: Minimal tenderness in central chest, no stepoff, no crepitus Abdominal:     General: Bowel sounds are normal.     Palpations: Abdomen is soft.     Comments: Some tenderness palpation epigastrium  Skin:    General: Skin is warm and dry.     Capillary Refill: Capillary refill takes less than 2 seconds.  Neurological:     Mental Status: He is alert and oriented to person, place, and time.  Psychiatric:        Mood and Affect: Mood normal.        Behavior: Behavior normal.     ED Results / Procedures / Treatments   Labs (all labs ordered are listed, but only abnormal results are displayed) Labs Reviewed  BASIC METABOLIC PANEL - Abnormal; Notable for the  following components:      Result Value   Glucose, Bld 131 (*)    All other components within normal limits  TROPONIN I (HIGH SENSITIVITY) - Abnormal; Notable for the following components:   Troponin I (High Sensitivity) 370 (*)    All other components within normal limits  CBC  HEPARIN LEVEL (UNFRACTIONATED)  TROPONIN I (HIGH SENSITIVITY)    EKG EKG Interpretation  Date/Time:  Wednesday January 27 2022 10:27:14 EDT Ventricular Rate:  72 PR Interval:  170 QRS Duration: 84 QT Interval:  368 QTC Calculation: 402 R Axis:   -5 Text Interpretation: Normal sinus rhythm Normal ECG When compared with ECG of 05-Sep-2019 09:56, No significant change was found Confirmed by Octaviano Glow [phone]) on 01/27/2022 10:34:58 AM  Radiology DG Chest Port 1 View  Result Date: 01/27/2022 CLINICAL DATA:  Chest pain. EXAM: PORTABLE CHEST 1 VIEW COMPARISON:  None Available. FINDINGS: The cardiac silhouette, mediastinal and hilar contours are normal. The lungs are clear. No pleural effusions. No pneumothorax. The bony thorax is intact. IMPRESSION: No acute cardiopulmonary findings. Electronically Signed   By: Marijo Sanes M.D.   On: 01/27/2022 11:03    Procedures .Critical Care  Performed by: Anselmo Pickler, PA-C Authorized by: Anselmo Pickler, PA-C   Critical care provider statement:    Critical care time (minutes):  32   Critical care was necessary to treat or prevent imminent or life-threatening deterioration of the following conditions:  Cardiac failure (NSTEMI)   Critical care was time spent personally by me on the following activities:  Development of treatment plan with patient or surrogate, discussions with consultants, evaluation of patient's response to treatment, examination of patient, ordering and review of laboratory studies, ordering and review of radiographic studies, ordering and performing treatments and interventions, pulse oximetry, re-evaluation of patient's condition and  review of old charts   Care discussed with: admitting provider       Medications Ordered in ED Medications  morphine (PF) 4 MG/ML injection 2 mg (has no administration in time range)  nitroGLYCERIN (NITROSTAT) SL tablet 0.4 mg (has no administration in time range)  heparin bolus via infusion 4,000 Units (has no administration in time range)  heparin ADULT infusion 100 units/mL (25000 units/228m) (has no administration in time range)  alum & mag hydroxide-simeth (MAALOX/MYLANTA) 200-200-20 MG/5ML suspension 30 mL (30 mLs Oral Given 01/27/22 1105)    And  lidocaine (XYLOCAINE) 2 % viscous mouth solution 15 mL (15 mLs Oral Given 01/27/22 1105)  ED Course/ Medical Decision Making/ A&P Clinical Course as of 01/27/22 1240  Wed Jan 27, 2022  1214 This is a 71 year old male with a history of hypertension, diabetes, presenting to emergency department with chest discomfort and shortness of breath.  He reports symptoms ongoing for about a week, not specifically related to exertion but sometimes worse with exertion.  This morning he woke up from sleep with again discomfort and heaviness in his lower chest as well as shortness of breath and nausea.  Here in the ER he is afebrile with normal vital signs.  He is completely pain-free at the moment.  He is asymptomatic.  His EKG shows normal sinus rhythm without acute ischemic findings.  His lab work was concerning for elevated troponin at 370 initially.  He is awaiting a second troponin.  With his clinical history, I am concerned for acute coronary syndrome, NSTEMI, and have ordered heparin.  He took 325 of aspirin at home prior to coming in.  He is not having pain requiring nitroglycerin or additional medications.  We will discuss the case with cardiology. [MT]    Clinical Course User Index [MT] Trifan, Carola Rhine, MD                           Medical Decision Making Amount and/or Complexity of Data Reviewed Labs: ordered. Radiology: ordered.   This  patient is a 71 y.o. male who presents to the ED for concern of chest pain, burning, this involves an extensive number of treatment options, and is a complaint that carries with it a high risk of complications and morbidity. The emergent differential diagnosis prior to evaluation includes, but is not limited to,  ACS, AAS, PE, Mallory-Weiss, Boerhaave's, Pneumonia, acute bronchitis, asthma or COPD exacerbation, anxiety, MSK pain or traumatic injury to the chest, acid reflux versus other .   This is not an exhaustive differential.   Past Medical History / Co-morbidities / Social History: DM2, HTN, HLD, anxiety  Additional history: Chart reviewed. Pertinent results include: No previous cardiology evaluation of this patient.  Reviewed some outpatient family medicine visits over the past 2 years, including lab work.  Patient with fairly well-controlled diabetes at this time, not insulin dependent  Physical Exam: Physical exam performed. The pertinent findings include: Patient with some tenderness to palpation of epigastric region and central chest wall.  No cardiac arrhythmia, no accessory lung sounds noted.  Lab Tests: I ordered, and personally interpreted labs.  The pertinent results include: CBC, BMP overall unremarkable, minimal hyperglycemia, glucose 131.  Initial troponin elevated at 370.  Especially with patient's symptoms, risk factor is extremely concerning for NSTEMI presentation.   Imaging Studies: I ordered imaging studies including plain film cxr. I independently visualized and interpreted imaging which showed no acute intrathoracic abnormality. I agree with the radiologist interpretation.   Cardiac Monitoring:  The patient was maintained on a cardiac monitor.  My attending physician Dr. Langston Masker viewed and interpreted the cardiac monitored which showed an underlying rhythm of: Normal sinus rhythm, no ischemic pattern on EKG. I agree with this interpretation.   Medications: I ordered  medication including initially administered Maalox, Mylanta as patient had burning epigastric pain with some upper abdominal tenderness, some clinical suspicion for possible severe acid reflux, additionally administered morphine, nitroglycerin, heparin especially in context of elevated troponin.  Patient with improvement of chest pain, on reevaluation is pain-free, symptoms still concerning for ongoing NSTEMI.  Consultations Obtained: I requested consultation with  the cardiologist, spoke with Dr. Marisue Ivan,  and discussed lab and imaging findings as well as pertinent plan - they recommend: Admission, likely transfer to [facility] for Cath Lab   Disposition: After consideration of the diagnostic results and the patients response to treatment, I feel that patient's presentation is consistent with NSTEMI, he will be admitted, transferred to [facility] for NSTEMI, will administer heparin, morphine, nitroglycerin at this time..   I discussed this case with my attending physician Dr. Langston Masker who cosigned this note including patient's presenting symptoms, physical exam, and planned diagnostics and interventions. Attending physician stated agreement with plan or made changes to plan which were implemented.    Final Clinical Impression(s) / ED Diagnoses Final diagnoses:  NSTEMI (non-ST elevated myocardial infarction) [facility])    Rx / DC Orders ED Discharge Orders     None         Anselmo Pickler, PA-C 01/27/22 1240    Wyvonnia Dusky, MD 01/27/22 1320

## 2022-01-27 NOTE — ED Notes (Signed)
Pt informed [facility] is on the way to get pt

## 2022-01-27 NOTE — H&P (Addendum)
Cardiology Admission History and Physical:  Patient ID: Robert Mooney MRN: [id number] DOB: 1950/10/03  Admit date: 01/27/2022  Primary Care Provider: Corine Shelter, PA-C Primary Cardiologist: None  Primary Electrophysiologist:  None   Chief Complaint: Chest discomfort  Patient Profile:  Robert Mooney is a 71 y.o. male with diabetes, hypertension, hyperlipidemia who presents with 1 week of chest discomfort and elevated troponin consistent with non-STEMI.  History of Present Illness:  Robert Mooney reports for the last week he has had episodes of burning in his chest.  Symptoms are occurring daily.  He reports he has had episodes periodically.  Apparently can occur at rest.  Can occur with activity.  He also reports exertional shortness of breath.  He apparently was awoken in his sleep last night with this discomfort.  He reports he got sweaty as well.  He had shortness of breath with activity as well.  Denies any fevers or chills.  He reports receiving back injections and being a little more active.  He now notices he is more short of breath and the chest discomfort is coming on.  He presented to the emergency room with stable vital signs.  Creatinine 0.64.  Troponin 370.  Hemoglobin 15.  Platelets are normal.  No bleeding issues reported.  Chest x-ray clear.  EKG with no acute ischemic changes.  He is diabetic for over 10 years.  He is a never smoker.  No alcohol or drug use is reported.  He is retired.  He is currently without any chest discomfort.  We discussed that he is likely having a non-STEMI.  We discussed risk and benefits of invasive angiography.  He is willing to proceed.  Heart Pathway Score:       Past Medical History: Past Medical History:  Diagnosis Date   Anxiety    Diabetes ([place])    High cholesterol    Hypertension     Past Surgical History: Past Surgical History:  Procedure Laterality Date   ROTATOR CUFF REPAIR Right 2008     Medications Prior to Admission: Prior  to Admission medications   Medication Sig Start Date End Date Taking? Authorizing Provider  ACCU-CHEK AVIVA PLUS test strip TEST BLOOD SUGAR ONCE A DAY 03/31/15   [provider]  ALPRAZolam (XANAX XR) 0.5 MG 24 hr tablet Take 0.25 mg by mouth once. Wife's RX     [provider]  gabapentin (NEURONTIN) 600 MG tablet Take 300 mg by mouth 2 (two) times daily. 1/2 tablet ('300mg'$ ) twice daily 06/26/19   [provider]  Garlic 7026 MG CAPS Take 1,000 mg by mouth daily.    [provider]  losartan (COZAAR) 50 MG tablet Take 50 mg by mouth daily. 06/26/19   [provider]  meclizine (ANTIVERT) 25 MG tablet Take 1 tablet (25 mg total) by mouth 3 (three) times daily as needed for dizziness. 09/05/19   Fransico Meadow, PA-C  metFORMIN (GLUCOPHAGE) 500 MG tablet Take 500 mg by mouth 2 (two) times daily. 01/21/15   [provider]  naproxen sodium (ANAPROX) 220 MG tablet Take 220 mg by mouth 2 (two) times daily with a meal.     [provider]  Omega-3 Fatty Acids (FISH OIL PO) Take 2 capsules by mouth daily. 1000    [provider]  pregabalin (LYRICA) 50 MG capsule Take 1 capsule (50 mg total) by mouth 2 (two) times daily. Patient not taking: Reported on 09/05/2019 06/09/15   Melvenia Beam, MD  Allergies:    No Known Allergies  Social History:   Social History   Socioeconomic History   Marital status: Married    Spouse name: Enid Derry    Number of children: 0   Years of education: 12   Highest education level: Not on file  Occupational History   Occupation: Retired  Tobacco Use   Smoking status: Never   Smokeless tobacco: Never  Vaping Use   Vaping Use: Never used  Substance and Sexual Activity   Alcohol use: No    Alcohol/week: 0.0 standard drinks of alcohol   Drug use: No   Sexual activity: Not on file  Other Topics Concern   Not on file  Social History Narrative   Lives with wife, Enid Derry.   Caffeine use: Drinks  coffee (2 cups per day)   Social Determinants of Health   Financial Resource Strain: Not on file  Food Insecurity: Not on file  Transportation Needs: Not on file  Physical Activity: Not on file  Stress: Not on file  Social Connections: Not on file  Intimate Partner Violence: Not on file     Family History:   The patient's family history is negative for Neuropathy.    ROS:  All other ROS reviewed and negative. Pertinent positives noted in the HPI.     Physical Exam/Data:   Vitals:   01/27/22 1115 01/27/22 1130 01/27/22 1145 01/27/22 1200  BP:    131/66  Pulse: 73 74 72 73  Resp: '13 11 12 12  '$ Temp:      TempSrc:      SpO2: 98% 97% 94% 94%  Weight:      Height:       No intake or output data in the 24 hours ending 01/27/22 1253     01/27/2022   10:22 AM 09/05/2019   10:02 AM 06/09/2015    9:24 AM  Last 3 Weights  Weight (lbs) 235 lb 14.3 oz 236 lb 226 lb 9.6 oz  Weight (kg) 107 kg 107.049 kg 102.785 kg    Body mass index is 35.87 kg/m.  General: Well nourished, well developed, in no acute distress Head: Atraumatic, normal size  Eyes: PEERLA, EOMI  Neck: Supple, no JVD Endocrine: No thryomegaly Cardiac: Normal S1, S2; RRR; no murmurs, rubs, or gallops Lungs: Clear to auscultation bilaterally, no wheezing, rhonchi or rales  Abd: Soft, nontender, no hepatomegaly  Ext: No edema, pulses 2+ Musculoskeletal: No deformities, BUE and BLE strength normal and equal Skin: Warm and dry, no rashes   Neuro: Alert and oriented to person, place, time, and situation, CNII-XII grossly intact, no focal deficits  Psych: Normal mood and affect   EKG:  The ECG that was done was personally reviewed and demonstrates normal sinus rhythm heart rate 72, no acute ischemic changes or evidence of infarction  Relevant CV Studies: Echo pending  Laboratory Data: High Sensitivity Troponin:   Recent Labs  Lab 01/27/22 1058  TROPONINIHS 370*      Cardiac EnzymesNo results for input(s):  "TROPONINI" in the last 168 hours. No results for input(s): "TROPIPOC" in the last 168 hours.  Chemistry Recent Labs  Lab 01/27/22 1058  NA 137  K 4.2  CL 106  CO2 24  GLUCOSE 131*  BUN 14  CREATININE 0.64  CALCIUM 9.6  GFRNONAA >60  ANIONGAP 7    No results for input(s): "PROT", "ALBUMIN", "AST", "ALT", "ALKPHOS", "BILITOT" in the last 168 hours. Hematology Recent Labs  Lab 01/27/22 1058  WBC  7.6  RBC 4.79  HGB 15.0  HCT 44.5  MCV 92.9  MCH 31.3  MCHC 33.7  RDW 12.2  PLT 232   BNPNo results for input(s): "BNP", "PROBNP" in the last 168 hours.  DDimer No results for input(s): "DDIMER" in the last 168 hours.  Radiology/Studies:  DG Chest Port 1 View  Result Date: 01/27/2022 CLINICAL DATA:  Chest pain. EXAM: PORTABLE CHEST 1 VIEW COMPARISON:  None Available. FINDINGS: The cardiac silhouette, mediastinal and hilar contours are normal. The lungs are clear. No pleural effusions. No pneumothorax. The bony thorax is intact. IMPRESSION: No acute cardiopulmonary findings. Electronically Signed   By: Marijo Sanes M.D.   On: 01/27/2022 11:03    Assessment and Plan:   #NSTEMI -He presents with intermittent chest discomfort for the past 1 week.  Also with exertional shortness of breath.  EKG with no acute ischemic changes.  Troponin is elevated. -Working diagnosis is an acute coronary syndrome.  -Has received 325 mg of aspirin.  Start heparin drip.  I have ordered metoprolol to tartrate 25 mg twice daily. -He has a longstanding history of statin intolerance.  He also reports intolerance to Zetia.  We will hold these medications.  He will need to be considered for outpatient PCSK9 inhibitor. -We will check lipids tomorrow morning, A1c and TSH.  Kidney function is normal. -He is in no signs of distress.  Currently without any chest discomfort.   -We will proceed with left heart catheterization today.  We will give him sublingual nitro as needed for chest discomfort.  He seems to be  resting comfortably. -He will need an echo. -Risk and benefits of left heart catheterization were explained.  He is willing to proceed.  Shared Decision Making/Informed Consent The risks [stroke (1 in 1000), death (1 in 1000), kidney failure [usually temporary] (1 in 500), bleeding (1 in 200), allergic reaction [possibly serious] (1 in 200)], benefits (diagnostic support and management of coronary artery disease) and alternatives of a cardiac catheterization were discussed in detail with Mr. Fabiano and he is willing to proceed.  #HTN -Metoprolol tartrate 25 mg twice daily.  Continue home losartan.  # Hyperlipidemia -Longstanding history of statin intolerance.  Also intolerant to Zetia.  Hold his medications.  Outpatient PCSK9 inhibitor evaluation.  # Diabetes -On metformin at home.  Sliding scale insulin while in-house.  # FEN -Precath fluids -DVT Ppx: Heparin drip -Code: Full -Disposition: Admit to inpatient for non-STEMI.  Will need invasive angiography +/- percutaneous coronary intervention  Severity of Illness: The appropriate patient status for this patient is INPATIENT. Inpatient status is judged to be reasonable and necessary in order to provide the required intensity of service to ensure the patient's safety. The patient's presenting symptoms, physical exam findings, and initial radiographic and laboratory data in the context of their chronic comorbidities is felt to place them at high risk for further clinical deterioration. Furthermore, it is not anticipated that the patient will be medically stable for discharge from the hospital within 2 midnights of admission.   * I certify that at the point of admission it is my clinical judgment that the patient will require inpatient hospital care spanning beyond 2 midnights from the point of admission due to high intensity of service, high risk for further deterioration and high frequency of surveillance required.*   For questions or  updates, please contact [place] Please consult [email or web address] for contact info under     Signed, [place] Bells T. Audie Box, MD, [place]  [facility]  01/27/2022 12:53 PM

## 2022-01-27 NOTE — Progress Notes (Signed)
ANTICOAGULATION CONSULT NOTE - Initial Consult  Pharmacy Consult for Heparin Indication: chest pain/ACS  No Known Allergies  Patient Measurements: Height: '5\' 8"'$  (172.7 cm) Weight: 107 kg (235 lb 14.3 oz) IBW/kg (Calculated) : 68.4 Heparin Dosing Weight: 92 Kg  Labs: Recent Labs    01/27/22 1058  HGB 15.0  HCT 44.5  PLT 232  CREATININE 0.64  TROPONINIHS 370*    Estimated Creatinine Clearance: 97.5 mL/min (by C-G formula based on SCr of 0.64 mg/dL).  Assessment: 71 year old male hx DM, HTN, HLD admitted with chest pain and elevated troponins. Pharmacy consulted to assist with systemic heparin anticoagulation for ACS. No blood thinners noted prior to admission.   Goal of Therapy:  Heparin level 0.3-0.7 units/ml Monitor platelets by anticoagulation protocol: Yes   Plan:  Heparin bolux 4000 units IV x 1 Heparin drip 1150 units/hr = 11.5 ml/hr Heparin level in 8 hours Daily HL and CBC Monitor for s/sx bleeding  Lorenso Courier, PharmD Clinical Pharmacist 01/27/2022 12:20 PM

## 2022-01-27 NOTE — Interval H&P Note (Signed)
History and Physical Interval Note:  01/27/2022 3:47 PM  Robert Mooney  has presented today for surgery, with the diagnosis of nstemi.  The various methods of treatment have been discussed with the patient and family. After consideration of risks, benefits and other options for treatment, the patient has consented to  Procedure(s): LEFT HEART CATH AND CORONARY ANGIOGRAPHY (N/A) as a surgical intervention.  The patient's history has been reviewed, patient examined, no change in status, stable for surgery.  I have reviewed the patient's chart and labs.  Questions were answered to the patient's satisfaction.    Cath Lab Visit (complete for each Cath Lab visit)  Clinical Evaluation Leading to the Procedure:   ACS: Yes.    Non-ACS:    Anginal Classification: CCS III  Anti-ischemic medical therapy: Maximal Therapy (2 or more classes of medications)  Non-Invasive Test Results: No non-invasive testing performed  Prior CABG: No previous CABG        Robert Mooney

## 2022-01-27 NOTE — ED Notes (Signed)
Report given to Robert Mooney with [facility]

## 2022-01-27 NOTE — ED Notes (Signed)
ED Provider at bedside. 

## 2022-01-27 NOTE — ED Triage Notes (Signed)
Pt to the ED with intermittent centralized burning chest pain that began on Thursday last week.

## 2022-01-28 ENCOUNTER — Other Ambulatory Visit (HOSPITAL_COMMUNITY): Payer: Self-pay

## 2022-01-28 ENCOUNTER — Observation Stay (HOSPITAL_BASED_OUTPATIENT_CLINIC_OR_DEPARTMENT_OTHER): Payer: Medicare Other

## 2022-01-28 ENCOUNTER — Telehealth (HOSPITAL_COMMUNITY): Payer: Self-pay | Admitting: Pharmacy Technician

## 2022-01-28 ENCOUNTER — Encounter (HOSPITAL_COMMUNITY): Payer: Self-pay | Admitting: Cardiovascular Disease

## 2022-01-28 DIAGNOSIS — Z79899 Other long term (current) drug therapy: Secondary | ICD-10-CM | POA: Diagnosis not present

## 2022-01-28 DIAGNOSIS — Z7984 Long term (current) use of oral hypoglycemic drugs: Secondary | ICD-10-CM | POA: Diagnosis not present

## 2022-01-28 DIAGNOSIS — I2510 Atherosclerotic heart disease of native coronary artery without angina pectoris: Secondary | ICD-10-CM

## 2022-01-28 DIAGNOSIS — E119 Type 2 diabetes mellitus without complications: Secondary | ICD-10-CM | POA: Diagnosis not present

## 2022-01-28 DIAGNOSIS — I10 Essential (primary) hypertension: Secondary | ICD-10-CM | POA: Diagnosis not present

## 2022-01-28 DIAGNOSIS — I214 Non-ST elevation (NSTEMI) myocardial infarction: Secondary | ICD-10-CM | POA: Diagnosis not present

## 2022-01-28 LAB — CBC
HCT: 41.4 % (ref 39.0–52.0)
Hemoglobin: 13.9 g/dL (ref 13.0–17.0)
MCH: 31 pg (ref 26.0–34.0)
MCHC: 33.6 g/dL (ref 30.0–36.0)
MCV: 92.4 fL (ref 80.0–100.0)
Platelets: 220 10*3/uL (ref 150–400)
RBC: 4.48 MIL/uL (ref 4.22–5.81)
RDW: 12.4 % (ref 11.5–15.5)
WBC: 10.5 10*3/uL (ref 4.0–10.5)
nRBC: 0 % (ref 0.0–0.2)

## 2022-01-28 LAB — LIPID PANEL
Cholesterol: 185 mg/dL (ref 0–200)
HDL: 31 mg/dL — ABNORMAL LOW (ref >40)
LDL Cholesterol: 126 mg/dL — ABNORMAL HIGH (ref 0–99)
Total CHOL/HDL Ratio: 6 RATIO
Triglycerides: 138 mg/dL (ref <150)
VLDL: 28 mg/dL (ref 0–40)

## 2022-01-28 LAB — BASIC METABOLIC PANEL
Anion gap: 9 (ref 5–15)
BUN: 10 mg/dL (ref 8–23)
CO2: 23 mmol/L (ref 22–32)
Calcium: 9.2 mg/dL (ref 8.9–10.3)
Chloride: 107 mmol/L (ref 98–111)
Creatinine, Ser: 0.67 mg/dL (ref 0.61–1.24)
GFR, Estimated: >60 mL/min (ref >60)
Glucose, Bld: 121 mg/dL — ABNORMAL HIGH (ref 70–99)
Potassium: 3.8 mmol/L (ref 3.5–5.1)
Sodium: 139 mmol/L (ref 135–145)

## 2022-01-28 LAB — ECHOCARDIOGRAM COMPLETE
Area-P 1/2: 3.91 cm2
Calc EF: 63.7 %
Height: 68 in
S' Lateral: 2.4 cm
Single Plane A2C EF: 66.3 %
Single Plane A4C EF: 63.5 %
Weight: 3774.28 oz

## 2022-01-28 LAB — HEMOGLOBIN A1C
Hgb A1c MFr Bld: 5.9 % — ABNORMAL HIGH (ref 4.8–5.6)
Mean Plasma Glucose: 122.63 mg/dL

## 2022-01-28 MED ORDER — ATORVASTATIN CALCIUM 10 MG PO TABS
10.0000 mg | ORAL_TABLET | Freq: Every day | ORAL | Status: DC
  Administered 2022-01-28: 10 mg via ORAL
  Filled 2022-01-28: qty 1

## 2022-01-28 MED ORDER — ASPIRIN 81 MG PO TBEC
81.0000 mg | DELAYED_RELEASE_TABLET | Freq: Every day | ORAL | 2 refills | Status: AC
  Filled 2022-01-28: qty 90, 90d supply, fill #0

## 2022-01-28 MED ORDER — METOPROLOL TARTRATE 25 MG PO TABS
25.0000 mg | ORAL_TABLET | Freq: Two times a day (BID) | ORAL | 0 refills | Status: DC
  Filled 2022-01-28: qty 180, 90d supply, fill #0

## 2022-01-28 MED ORDER — PERFLUTREN LIPID MICROSPHERE
1.0000 mL | INTRAVENOUS | Status: AC | PRN
  Administered 2022-01-28: 2 mL via INTRAVENOUS

## 2022-01-28 MED ORDER — TICAGRELOR 90 MG PO TABS
90.0000 mg | ORAL_TABLET | Freq: Two times a day (BID) | ORAL | 2 refills | Status: DC
  Filled 2022-01-28: qty 60, 30d supply, fill #0

## 2022-01-28 MED ORDER — NITROGLYCERIN 0.4 MG SL SUBL
0.4000 mg | SUBLINGUAL_TABLET | SUBLINGUAL | 2 refills | Status: DC | PRN
Start: 2022-01-28 — End: 2022-05-07
  Filled 2022-01-28: qty 25, 14d supply, fill #0

## 2022-01-28 MED FILL — Nitroglycerin IV Soln 100 MCG/ML in D5W: INTRA_ARTERIAL | Qty: 10 | Status: AC

## 2022-01-28 NOTE — Progress Notes (Signed)
CARDIAC REHAB PHASE I     Pt has been cleared per MD for discharge home. He is feeling better now with wife at bedside. Home education including MI booklet, site care, restrictions, risk factors, heart healthy diabetic diet, exercise guidelines, and CRP2. Will refer to AP for CRP2. All questions and concerns addressed.   [id number]  Vanessa Barbara, RN BSN 01/28/2022 1:39 PM

## 2022-01-28 NOTE — Telephone Encounter (Signed)
Pharmacy Patient Advocate Encounter  Insurance verification completed.    The patient is insured through [employer] Medicare Part D   The patient is currently admitted and ran test claims for the following: Brilinta .  Copays and coinsurance results were relayed to Inpatient clinical team.  

## 2022-01-28 NOTE — Discharge Summary (Addendum)
Discharge Summary    Patient ID: Robert Mooney MRN: [id number]; DOB: 1950/07/28  Admit date: 01/27/2022 Discharge date: 01/28/2022  PCP:  [facility], [place] Providers Cardiologist:  Evalina Field, MD    (seen in [place])    Discharge Diagnoses    Principal Problem:   NSTEMI (non-ST elevated myocardial infarction) [facility]) Active Problems:   Diabetes ([place])   HLD (hyperlipidemia)   HTN (hypertension)  Diagnostic Studies/Procedures    Cath: 01/27/22    Ost RCA lesion is 30% stenosed.   Prox RCA to Mid RCA lesion is 30% stenosed.   Prox Cx to Mid Cx lesion is 99% stenosed.   Prox LAD lesion is 30% stenosed.   Mid LAD lesion is 40% stenosed.   1st Mrg lesion is 60% stenosed.   2nd Mrg lesion is 99% stenosed.   A drug-eluting stent was successfully placed using a SYNERGY XD 2.75X38.   Post intervention, there is a 0% residual stenosis.   The left ventricular systolic function is normal.   LV end diastolic pressure is normal.   The left ventricular ejection fraction is 50-55% by visual estimate.   There is no mitral valve regurgitation.   Mild non-obstructive disease in the proximal and mid LAD Severe mid Circumflex stenosis. There are two small caliber obtuse marginal branches. The first obtuse marginal branch has moderate stenosis. The second obtuse marginal branch has severe ostial stenosis. Both of these branches are too small for PCI.  Successful PTCA/DES x 1 mid Circumflex The RCA is a large dominant vessel with mild non-obstructive disease.  LV systolic function appears normal but poor opacification of the LV cavity with contrast.    Recommendations: Will monitor overnight. Plan for DAPT with ASA and Brilinta for one year. Will start high intensity statin. Continue beta blocker. Echo tomorrow.  Diagnostic Dominance: Right  Intervention    Echo: 01/27/22  IMPRESSIONS     1. Left ventricular ejection fraction, by estimation, is  60 to 65%. The  left ventricle has normal function. The left ventricle has no regional  wall motion abnormalities. There is mild left ventricular hypertrophy.  Left ventricular diastolic parameters  were normal.   2. Right ventricular systolic function is normal. The right ventricular  size is normal. There is normal pulmonary artery systolic pressure. The  estimated right ventricular systolic pressure is 96.2 mmHg.   3. The mitral valve is normal in structure. Mild mitral valve  regurgitation. No evidence of mitral stenosis.   4. The aortic valve is grossly normal. Aortic valve regurgitation is not  visualized. No aortic stenosis is present.   5. The inferior vena cava is normal in size with greater than 50%  respiratory variability, suggesting right atrial pressure of 3 mmHg.   FINDINGS   Left Ventricle: Left ventricular ejection fraction, by estimation, is 60  to 65%. The left ventricle has normal function. The left ventricle has no  regional wall motion abnormalities. Definity contrast agent was given IV  to delineate the left ventricular   endocardial borders. The left ventricular internal cavity size was normal  in size. There is mild left ventricular hypertrophy. Left ventricular  diastolic parameters were normal.   Right Ventricle: The right ventricular size is normal. No increase in  right ventricular wall thickness. Right ventricular systolic function is  normal. There is normal pulmonary artery systolic pressure. The tricuspid  regurgitant velocity is 2.13 m/s, and   with an assumed right atrial  pressure of 3 mmHg, the estimated right  ventricular systolic pressure is 52.7 mmHg.   Left Atrium: Left atrial size was normal in size.   Right Atrium: Right atrial size was normal in size.   Pericardium: Trivial pericardial effusion is present.   Mitral Valve: The mitral valve is normal in structure. Mild mitral valve  regurgitation. No evidence of mitral valve stenosis.    Tricuspid Valve: The tricuspid valve is normal in structure. Tricuspid  valve regurgitation is trivial. No evidence of tricuspid stenosis.   Aortic Valve: The aortic valve is grossly normal. Aortic valve  regurgitation is not visualized. No aortic stenosis is present.   Pulmonic Valve: The pulmonic valve was normal in structure. Pulmonic valve  regurgitation is trivial. No evidence of pulmonic stenosis.   Aorta: The aortic root is normal in size and structure.   Venous: The inferior vena cava is normal in size with greater than 50%  respiratory variability, suggesting right atrial pressure of 3 mmHg.   IAS/Shunts: No atrial level shunt detected by color flow Doppler.   Additional Comments: Dilated coronary sinus.  _____________   History of Present Illness     Robert Mooney is a 71 y.o. male with diabetes, hypertension, hyperlipidemia who presented with 1 week of chest discomfort and elevated troponin consistent with non-STEMI.   [name] reported for the last week he has had episodes of burning in his chest.  Symptoms are occurring daily.  He reported he has had episodes periodically.  Apparently can occur at rest.  Can occur with activity.  He also reported exertional shortness of breath.  He apparently was awoken in his sleep last night with this discomfort.  He reported he got sweaty as well.  He had shortness of breath with activity as well.  Denied any fevers or chills.  He reported receiving back injections and being a little more active.  He began to noticed he was more short of breath and the chest discomfort was coming on. He presented to the emergency room with stable vital signs.  Creatinine 0.64. Troponin 370.  Hemoglobin 15.  Platelets are normal.  No bleeding issues reported.  Chest x-ray clear.  EKG with no acute ischemic changes.  He's diabetic for over 10 years.  He was a never smoker.  No alcohol or drug use is reported.  Given his elevated troponin he was  transferred to [facility] for cardiac cath.   Hospital Course      Non-STEMI: High-sensitivity troponin peaked at 417.  Cardiac catheterization noted above with mild nonobstructive disease in the proximal/mid LAD, severe mid left circumflex stenosis treated with PCI/DES x 1.  First OM with moderate stenosis, second OM with severe ostial stenosis.  RCA large dominant vessel with mild nonobstructive disease.  Placed on DAPT with aspirin/Brilinta for at least 1 year.  No recurrent chest pain.  Seen by cardiac rehab.  Plan for medical therapy for residual CAD.  Follow-up echocardiogram showed LVEF of 60-65% with no rWMA.  --Continue on aspirin, Brilinta, metoprolol 25 mg twice daily, losartan 50 mg daily  Hypertension: Stable --Continue metoprolol 25 mg twice daily, losartan 50 mg daily  Hyperlipidemia: LDL 126, HDL 31  -- Long history of statin intolerance as well as Zetia intolerance. -- plan to refer to the lipid clinic for consideration of PCSK9 inhibitor  Diabetes: Hemoglobin A1c 5.9 --Resume metformin at discharge  General: Well developed, well nourished, male appearing in no acute distress. Head: Normocephalic, atraumatic.  Neck: Supple  without bruits, JVD. Lungs:  Resp regular and unlabored, CTA. Heart: RRR, S1, S2, no S3, S4, or murmur; no rub. Abdomen: Soft, non-tender, non-distended with normoactive bowel sounds. No hepatomegaly. No rebound/guarding. No obvious abdominal masses. Extremities: No clubbing, cyanosis, edema. Distal pedal pulses are 2+ bilaterally. Right radial cath site stable without bruising or hematoma Neuro: Alert and oriented X 3. Moves all extremities spontaneously. Psych: Normal affect.  Patient was seen by Dr. Gwenlyn Found and deemed stable for discharge home.  Follow-up arranged in the office.  Medication sent to [place]. Educated by pharmD prior to discharge.   Did the patient have an acute coronary syndrome (MI, NSTEMI, STEMI, etc) this admission?:  Yes                                AHA/ACC Clinical Performance & Quality Measures: Aspirin prescribed? - Yes ADP Receptor Inhibitor (Plavix/Clopidogrel, Brilinta/Ticagrelor or Effient/Prasugrel) prescribed (includes medically managed patients)? - Yes Beta Blocker prescribed? - Yes High Intensity Statin (Lipitor 40-'80mg'$  or Crestor 20-'40mg'$ ) prescribed? - No - intolerant to statins EF assessed during THIS hospitalization? - Yes For EF <40%, was ACEI/ARB prescribed? - Not Applicable (EF >/= 56%) For EF <40%, Aldosterone Antagonist (Spironolactone or Eplerenone) prescribed? - Not Applicable (EF >/= 81%) Cardiac Rehab Phase II ordered (including medically managed patients)? - Yes       The patient will be scheduled for a TOC follow up appointment in 10-14 days.  A message has been sent to the [facility] and Scheduling Pool at the office where the patient should be seen for follow up.  _____________  Discharge Vitals Blood pressure (!) 150/74, pulse 78, temperature 97.7 F (36.5 C), temperature source Oral, resp. rate 15, height '5\' 8"'$  (1.727 m), weight 107 kg, SpO2 99 %.  Filed Weights   01/27/22 1022  Weight: 107 kg    Labs & Radiologic Studies    CBC Recent Labs    01/27/22 1058 01/28/22 0334  WBC 7.6 10.5  HGB 15.0 13.9  HCT 44.5 41.4  MCV 92.9 92.4  PLT 232 275   Basic Metabolic Panel Recent Labs    01/27/22 1058 01/28/22 0334  NA 137 139  K 4.2 3.8  CL 106 107  CO2 24 23  GLUCOSE 131* 121*  BUN 14 10  CREATININE 0.64 0.67  CALCIUM 9.6 9.2   Liver Function Tests No results for input(s): "AST", "ALT", "ALKPHOS", "BILITOT", "PROT", "ALBUMIN" in the last 72 hours. No results for input(s): "LIPASE", "AMYLASE" in the last 72 hours. High Sensitivity Troponin:   Recent Labs  Lab 01/27/22 1058 01/27/22 1245  TROPONINIHS 370* 417*    BNP Invalid input(s): "POCBNP" D-Dimer No results for input(s): "DDIMER" in the last 72 hours. Hemoglobin A1C Recent Labs    01/28/22 0334   HGBA1C 5.9*   Fasting Lipid Panel Recent Labs    01/28/22 0334  CHOL 185  HDL 31*  LDLCALC 126*  TRIG 138  CHOLHDL 6.0   Thyroid Function Tests No results for input(s): "TSH", "T4TOTAL", "T3FREE", "THYROIDAB" in the last 72 hours.  Invalid input(s): "FREET3" _____________  ECHOCARDIOGRAM COMPLETE  Result Date: 01/28/2022    ECHOCARDIOGRAM REPORT   Patient Name:   Robert Mooney Date of Exam: 01/28/2022 Medical Rec #:  [id number]       Height:       68.0 in Accession #:    [id number]      Weight:  235.9 lb Date of Birth:  July 13, 1950        BSA:          2.192 m Patient Age:    38 years        BP:           144/76 mmHg Patient Gender: M               HR:           74 bpm. Exam Location:  Inpatient Procedure: 2D Echo, Cardiac Doppler, Color Doppler and Intracardiac            Opacification Agent Indications:    CAD Native Vessel I25.10  History:        Patient has no prior history of Echocardiogram examinations.                 Risk Factors:Hypertension and Diabetes.  Sonographer:    Bernadene Person RDCS Referring Phys: [place]  1. Left ventricular ejection fraction, by estimation, is 60 to 65%. The left ventricle has normal function. The left ventricle has no regional wall motion abnormalities. There is mild left ventricular hypertrophy. Left ventricular diastolic parameters were normal.  2. Right ventricular systolic function is normal. The right ventricular size is normal. There is normal pulmonary artery systolic pressure. The estimated right ventricular systolic pressure is 44.8 mmHg.  3. The mitral valve is normal in structure. Mild mitral valve regurgitation. No evidence of mitral stenosis.  4. The aortic valve is grossly normal. Aortic valve regurgitation is not visualized. No aortic stenosis is present.  5. The inferior vena cava is normal in size with greater than 50% respiratory variability, suggesting right atrial pressure of 3 mmHg. FINDINGS  Left  Ventricle: Left ventricular ejection fraction, by estimation, is 60 to 65%. The left ventricle has normal function. The left ventricle has no regional wall motion abnormalities. Definity contrast agent was given IV to delineate the left ventricular  endocardial borders. The left ventricular internal cavity size was normal in size. There is mild left ventricular hypertrophy. Left ventricular diastolic parameters were normal. Right Ventricle: The right ventricular size is normal. No increase in right ventricular wall thickness. Right ventricular systolic function is normal. There is normal pulmonary artery systolic pressure. The tricuspid regurgitant velocity is 2.13 m/s, and  with an assumed right atrial pressure of 3 mmHg, the estimated right ventricular systolic pressure is 18.5 mmHg. Left Atrium: Left atrial size was normal in size. Right Atrium: Right atrial size was normal in size. Pericardium: Trivial pericardial effusion is present. Mitral Valve: The mitral valve is normal in structure. Mild mitral valve regurgitation. No evidence of mitral valve stenosis. Tricuspid Valve: The tricuspid valve is normal in structure. Tricuspid valve regurgitation is trivial. No evidence of tricuspid stenosis. Aortic Valve: The aortic valve is grossly normal. Aortic valve regurgitation is not visualized. No aortic stenosis is present. Pulmonic Valve: The pulmonic valve was normal in structure. Pulmonic valve regurgitation is trivial. No evidence of pulmonic stenosis. Aorta: The aortic root is normal in size and structure. Venous: The inferior vena cava is normal in size with greater than 50% respiratory variability, suggesting right atrial pressure of 3 mmHg. IAS/Shunts: No atrial level shunt detected by color flow Doppler. Additional Comments: Dilated coronary sinus.  LEFT VENTRICLE PLAX 2D LVIDd:         4.30 cm     Diastology LVIDs:         2.40 cm  LV e' medial:    7.36 cm/s LV PW:         1.10 cm     LV E/e' medial:   13.9 LV IVS:        1.20 cm     LV e' lateral:   7.71 cm/s LVOT diam:     2.00 cm     LV E/e' lateral: 13.2 LV SV:         74 LV SV Index:   34 LVOT Area:     3.14 cm  LV Volumes (MOD) LV vol d, MOD A2C: 64.7 ml LV vol d, MOD A4C: 67.7 ml LV vol s, MOD A2C: 21.8 ml LV vol s, MOD A4C: 24.7 ml LV SV MOD A2C:     42.9 ml LV SV MOD A4C:     67.7 ml LV SV MOD BP:      42.5 ml RIGHT VENTRICLE RV S prime:     16.10 cm/s TAPSE (M-mode): 1.8 cm LEFT ATRIUM             Index        RIGHT ATRIUM           Index LA diam:        4.40 cm 2.01 cm/m   RA Area:     15.40 cm LA Vol (A2C):   35.3 ml 16.10 ml/m  RA Volume:   33.40 ml  15.24 ml/m LA Vol (A4C):   37.7 ml 17.20 ml/m LA Biplane Vol: 36.5 ml 16.65 ml/m  AORTIC VALVE LVOT Vmax:   121.00 cm/s LVOT Vmean:  77.700 cm/s LVOT VTI:    0.234 m  AORTA Ao Root diam: 3.20 cm Ao Asc diam:  3.20 cm MITRAL VALVE                TRICUSPID VALVE MV Area (PHT): 3.91 cm     TR Peak grad:   18.1 mmHg MV Decel Time: 194 msec     TR Vmax:        213.00 cm/s MV E velocity: 102.00 cm/s MV A velocity: 110.00 cm/s  SHUNTS MV E/A ratio:  0.93         Systemic VTI:  0.23 m                             Systemic Diam: 2.00 cm Cherlynn Kaiser MD Electronically signed by Cherlynn Kaiser MD Signature Date/Time: 01/28/2022/12:37:18 PM    Final    CARDIAC CATHETERIZATION  Result Date: 01/27/2022   Ost RCA lesion is 30% stenosed.   Prox RCA to Mid RCA lesion is 30% stenosed.   Prox Cx to Mid Cx lesion is 99% stenosed.   Prox LAD lesion is 30% stenosed.   Mid LAD lesion is 40% stenosed.   1st Mrg lesion is 60% stenosed.   2nd Mrg lesion is 99% stenosed.   A drug-eluting stent was successfully placed using a SYNERGY XD 2.75X38.   Post intervention, there is a 0% residual stenosis.   The left ventricular systolic function is normal.   LV end diastolic pressure is normal.   The left ventricular ejection fraction is 50-55% by visual estimate.   There is no mitral valve regurgitation. Mild non-obstructive  disease in the proximal and mid LAD Severe mid Circumflex stenosis. There are two small caliber obtuse marginal branches. The first obtuse marginal branch has moderate stenosis. The second obtuse marginal branch has severe  ostial stenosis. Both of these branches are too small for PCI. Successful PTCA/DES x 1 mid Circumflex The RCA is a large dominant vessel with mild non-obstructive disease. LV systolic function appears normal but poor opacification of the LV cavity with contrast. Recommendations: Will monitor overnight. Plan for DAPT with ASA and Brilinta for one year. Will start high intensity statin. Continue beta blocker. Echo tomorrow.   DG Chest Port 1 View  Result Date: 01/27/2022 CLINICAL DATA:  Chest pain. EXAM: PORTABLE CHEST 1 VIEW COMPARISON:  None Available. FINDINGS: The cardiac silhouette, mediastinal and hilar contours are normal. The lungs are clear. No pleural effusions. No pneumothorax. The bony thorax is intact. IMPRESSION: No acute cardiopulmonary findings. Electronically Signed   By: Marijo Sanes M.D.   On: 01/27/2022 11:03    Disposition   Pt is being discharged home today in good condition.  Follow-up Plans & Appointments     Follow-up Information     Erma Heritage, PA-C Follow up on 02/02/2022.   Specialties: Physician Assistant, Cardiology Why: at 3:30pm for your follow up appt with Dr. Racheal Patches' [place] information: [place] [phone]                Discharge Instructions     AMB Referral to [facility] Pharm-D   Complete by: As directed    Statin intolerance s/p ACS Ok for [facility]   Reason For Referral: Lipids   Call MD for:  difficulty breathing, headache or visual disturbances   Complete by: As directed    Call MD for:  persistant dizziness or light-headedness   Complete by: As directed    Call MD for:  redness, tenderness, or signs of infection (pain, swelling, redness, odor or green/yellow  discharge around incision site)   Complete by: As directed    Diet - low sodium heart healthy   Complete by: As directed    Discharge instructions   Complete by: As directed    Radial Site Care Refer to this sheet in the next few weeks. These instructions provide you with information on caring for yourself after your procedure. Your caregiver may also give you more specific instructions. Your treatment has been planned according to current medical practices, but problems sometimes occur. Call your caregiver if you have any problems or questions after your procedure. HOME CARE INSTRUCTIONS You may shower the day after the procedure. Remove the bandage (dressing) and gently wash the site with plain soap and water. Gently pat the site dry.  Do not apply powder or lotion to the site.  Do not submerge the affected site in water for 3 to 5 days.  Inspect the site at least twice daily.  Do not flex or bend the affected arm for 24 hours.  No lifting over 5 pounds (2.3 kg) for 5 days after your procedure.  Do not drive home if you are discharged the same day of the procedure. Have someone else drive you.  You may drive 24 hours after the procedure unless otherwise instructed by your caregiver.  What to expect: Any bruising will usually fade within 1 to 2 weeks.  Blood that collects in the tissue (hematoma) may be painful to the touch. It should usually decrease in size and tenderness within 1 to 2 weeks.  SEEK IMMEDIATE MEDICAL CARE IF: You have unusual pain at the radial site.  You have redness, warmth, swelling, or pain at the radial site.  You have drainage (other  than a small amount of blood on the dressing).  You have chills.  You have a fever or persistent symptoms for more than 72 hours.  You have a fever and your symptoms suddenly get worse.  Your arm becomes pale, cool, tingly, or numb.  You have heavy bleeding from the site. Hold pressure on the site.   PLEASE DO NOT MISS ANY DOSES OF  YOUR BRILINTA!!!!! Also keep a log of you blood pressures and bring back to your follow up appt. Please call the office with any questions.   Patients taking blood thinners should generally stay away from medicines like ibuprofen, Advil, Motrin, naproxen, and Aleve due to risk of stomach bleeding. You may take Tylenol as directed or talk to your primary doctor about alternatives.   PLEASE ENSURE THAT YOU DO NOT RUN OUT OF YOUR BRILINTA. This medication is very important to remain on for at least one year. IF you have issues obtaining this medication due to cost please CALL the office 3-5 business days prior to running out in order to prevent missing doses of this medication.   Increase activity slowly   Complete by: As directed        Discharge Medications   Allergies as of 01/28/2022   No Known Allergies      Medication List     TAKE these medications    Accu-Chek Aviva Plus test strip Generic drug: glucose blood TEST BLOOD SUGAR ONCE A DAY   aspirin EC 81 MG tablet Take 1 tablet (81 mg total) by mouth daily. Swallow whole. Start taking on: January 29, 2022   diphenhydrAMINE 25 mg capsule Commonly known as: BENADRYL Take 25 mg by mouth at bedtime as needed for allergies, sleep or itching.   gabapentin 600 MG tablet Commonly known as: NEURONTIN Take 300 mg by mouth 2 (two) times daily. 1/2 tablet ('300mg'$ ) twice daily   Garlic 4401 MG Caps Take 1,000 mg by mouth daily.   losartan 50 MG tablet Commonly known as: COZAAR Take 50 mg by mouth daily.   metFORMIN 500 MG tablet Commonly known as: GLUCOPHAGE Take 500 mg by mouth 2 (two) times daily.   metoprolol tartrate 25 MG tablet Commonly known as: LOPRESSOR Take 1 tablet (25 mg total) by mouth 2 (two) times daily.   naproxen sodium 220 MG tablet Commonly known as: ALEVE Take 220 mg by mouth 2 (two) times daily with a meal.   nitroGLYCERIN 0.4 MG SL tablet Commonly known as: NITROSTAT Place 1 tablet (0.4 mg total)  under the tongue every 5 (five) minutes as needed for chest pain.   ticagrelor 90 MG Tabs tablet Commonly known as: BRILINTA Take 1 tablet (90 mg total) by mouth 2 (two) times daily.   Vitamin D3 125 MCG (5000 UT) Caps Take 1 capsule by mouth daily.           Outstanding Labs/Studies   N/a   Duration of Discharge Encounter   Greater than 30 minutes including physician time.  Signed, Reino Bellis, NP 01/28/2022, 12:56 PM   Agree with note by Reino Bellis NP-C  Successful LCX PCI/DES by Dy McAlhaney. No CP over night. On DAPT. OK for DC home today. WIll arrange OP F/U.   Lorretta Harp, M.D., [place], [facility], [place] [address] [place], [place]  [address]  [phone] 01/28/2022 2:15 PM

## 2022-01-28 NOTE — TOC Benefit Eligibility Note (Signed)
Patient Teacher, English as a foreign language completed.    The patient is currently admitted and upon discharge could be taking Brilinta 90 mg.  The current 30 day co-pay is $47.00.   The patient is insured through [employer] [place] Patient Advocate Specialist [place] Patient Advocate Team Direct Number: [phone]  Fax: [phone]

## 2022-01-28 NOTE — Progress Notes (Signed)
Cardiac rehab was about to work with pt. Pt had apparently c/o of nausea, light headedness. Appears confused. Had also c/o abdominal pain  and had indicated he had 3 loose stools this AM .  BP at 146/73. Rechecked at 150/74. HR = 75. Pt states he does not know what has happened he just felt lightheaded and laid down. Sent secure chat to Dr. Audie Box Secure chat sent to Reino Bellis NP

## 2022-01-28 NOTE — Plan of Care (Signed)

## 2022-01-28 NOTE — Progress Notes (Signed)
CARDIAC REHAB PHASE I   PRE:  Rate/Rhythm: 74 SR   BP:  Sitting: 150/74      SaO2: 97 RA    Walked into room found pt lying flat in bed , he states I don't feel good. Pt had compliant of nausea, dizziness and headache. He had been up out of bed dressing getting ready for home. Pt is confused about what is happening and how long he had been lying down. Vitals signs are stable. Rey B. RN called into room to assess pt. He is notifying MD of changes in pt status. Will follow up with pt for discharge eduction later today. [phone]  Vanessa Barbara, RN BSN 01/28/2022 11:42 AM

## 2022-01-29 LAB — LIPOPROTEIN A (LPA): Lipoprotein (a): 15.8 nmol/L (ref <75.0)

## 2022-01-29 LAB — POCT ACTIVATED CLOTTING TIME: Activated Clotting Time: 311 seconds

## 2022-02-02 ENCOUNTER — Encounter: Payer: Self-pay | Admitting: Student

## 2022-02-02 ENCOUNTER — Ambulatory Visit: Payer: Medicare Other | Admitting: Student

## 2022-02-02 VITALS — BP 130/68 | HR 66 | Ht 68.0 in | Wt 231.2 lb

## 2022-02-02 DIAGNOSIS — Z789 Other specified health status: Secondary | ICD-10-CM

## 2022-02-02 DIAGNOSIS — I2510 Atherosclerotic heart disease of native coronary artery without angina pectoris: Secondary | ICD-10-CM | POA: Diagnosis not present

## 2022-02-02 DIAGNOSIS — I10 Essential (primary) hypertension: Secondary | ICD-10-CM

## 2022-02-02 DIAGNOSIS — E785 Hyperlipidemia, unspecified: Secondary | ICD-10-CM

## 2022-02-02 MED ORDER — TICAGRELOR 90 MG PO TABS: 90.0000 mg | ORAL_TABLET | Freq: Two times a day (BID) | ORAL | 2 refills | Status: DC

## 2022-02-02 MED ORDER — METOPROLOL TARTRATE 25 MG PO TABS
25.0000 mg | ORAL_TABLET | Freq: Two times a day (BID) | ORAL | 2 refills | Status: DC
Start: 2022-02-02 — End: 2022-08-20

## 2022-02-02 NOTE — Progress Notes (Signed)
Cardiology Office Note    Date:  02/02/2022   ID:  Robert Mooney, DOB 1951-06-14, MRN [id number]  PCP:  Ramiro Harvest, PA-C  Cardiologist: Evalina Field, MD    Chief Complaint  Patient presents with   Hospitalization Follow-up    History of Present Illness:    Robert Mooney is a 71 y.o. male with past medical history of HTN, HLD and Type 2 DM who presents to the office today for hospital follow-up.   He most recently presented to [facility] ED on 01/27/2022 for evaluation of intermittent chest pain for the past week and was found to have an NSTEMI with troponin values up to 417. He underwent a cardiac catheterization the day of admission which showed severe stenosis along the mid LCx which was treated with DES placement. He did have 2 small caliber OM branches but they were both too small for PCI. He had mild nonobstructive disease along the LAD and RCA with medical management recommended of his residual disease. Echocardiogram showed a preserved EF of 60 to 65% with no wall motion abnormalities. RV function was normal and he did have mild mitral valve regurgitation but no significant valve abnormalities. He was started on ASA and Brilinta along with Lopressor 25 mg twice daily. He had been intolerant to statins and Zetia, therefore was referred to the [facility] for consideration of PCSK9 inhibitor therapy.  In talking with the patient and his wife today, he reports overall doing well since his recent admission. He denies any recurrent exertional chest pain. Reports one brief episode of a shooting pain which lasted for a few seconds and spontaneously resolved. Says that his breathing has improved and he denies any recent orthopnea, PND or pitting edema. He remains on ASA and Brilinta and denies any melena, hematochezia or hematuria. He initially declined referral to the [facility] for consideration of PCSK9 inhibitor as he was unaware of what the referral was for. We reviewed  this again today and he is in agreement with proceeding.   Past Medical History:  Diagnosis Date   Anxiety    CAD (coronary artery disease)    a. s/p NSTEMI in 01/2022 with DES to mid-LCx   Diabetes ([place])    High cholesterol    Hypertension     Past Surgical History:  Procedure Laterality Date   CORONARY STENT INTERVENTION N/A 01/27/2022   Procedure: CORONARY STENT INTERVENTION;  Surgeon: Burnell Blanks, MD;  Location: [facility];  Service: Cardiovascular;  Laterality: N/A;   LEFT HEART CATH AND CORONARY ANGIOGRAPHY N/A 01/27/2022   Procedure: LEFT HEART CATH AND CORONARY ANGIOGRAPHY;  Surgeon: Burnell Blanks, MD;  Location: [place] [facility];  Service: Cardiovascular;  Laterality: N/A;   ROTATOR CUFF REPAIR Right 2008    Current Medications: Outpatient Medications Prior to Visit  Medication Sig Dispense Refill   ACCU-CHEK AVIVA PLUS test strip TEST BLOOD SUGAR ONCE A DAY  11   aspirin EC 81 MG tablet Take 1 tablet (81 mg total) by mouth daily. Swallow whole. 90 tablet 2   Cholecalciferol (VITAMIN D3) 125 MCG (5000 UT) CAPS Take 1 capsule by mouth daily.     diphenhydrAMINE (BENADRYL) 25 mg capsule Take 25 mg by mouth at bedtime as needed for allergies, sleep or itching.     gabapentin (NEURONTIN) 600 MG tablet Take 300 mg by mouth 2 (two) times daily. 1/2 tablet ('300mg'$ ) twice daily     Garlic 2376 MG  CAPS Take 1,000 mg by mouth daily.     losartan (COZAAR) 50 MG tablet Take 50 mg by mouth daily.     metFORMIN (GLUCOPHAGE) 500 MG tablet Take 500 mg by mouth 2 (two) times daily.  1   naproxen sodium (ALEVE) 220 MG tablet Take 220 mg by mouth 2 (two) times daily with a meal.     nitroGLYCERIN (NITROSTAT) 0.4 MG SL tablet Place 1 tablet (0.4 mg total) under the tongue every 5 (five) minutes as needed for chest pain. 25 tablet 2   metoprolol tartrate (LOPRESSOR) 25 MG tablet Take 1 tablet (25 mg total) by mouth 2 (two) times daily. 180 tablet 0   ticagrelor  (BRILINTA) 90 MG TABS tablet Take 1 tablet (90 mg total) by mouth 2 (two) times daily. 180 tablet 2   No facility-administered medications prior to visit.     Allergies:   Atorvastatin calcium, Ezetimibe, Pravastatin sodium, and Rosuvastatin calcium   Social History   Socioeconomic History   Marital status: Married    Spouse name: Enid Derry    Number of children: 0   Years of education: 12   Highest education level: Not on file  Occupational History   Occupation: Retired  Tobacco Use   Smoking status: Never   Smokeless tobacco: Never  Vaping Use   Vaping Use: Never used  Substance and Sexual Activity   Alcohol use: No    Alcohol/week: 0.0 standard drinks of alcohol   Drug use: No   Sexual activity: Not on file  Other Topics Concern   Not on file  Social History Narrative   Lives with wife, Enid Derry.   Caffeine use: Drinks coffee (2 cups per day)   Social Determinants of Health   Financial Resource Strain: Not on file  Food Insecurity: Not on file  Transportation Needs: Not on file  Physical Activity: Not on file  Stress: Not on file  Social Connections: Not on file     Family History:  The patient's family history is not on file.   Review of Systems:    Please see the history of present illness.     All other systems reviewed and are otherwise negative except as noted above.   Physical Exam:    VS:  BP 130/68   Pulse 66   Ht '5\' 8"'$  (1.727 m)   Wt 231 lb 3.2 oz (104.9 kg)   SpO2 97%   BMI 35.15 kg/m    General: Well developed, well nourished,male appearing in no acute distress. Head: Normocephalic, atraumatic. Neck: No carotid bruits. JVD not elevated.  Lungs: Respirations regular and unlabored, without wheezes or rales.  Heart: Regular rate and rhythm. No S3 or S4.  No murmur, no rubs, or gallops appreciated. Abdomen: Appears non-distended. No obvious abdominal masses. Msk:  Strength and tone appear normal for age. No obvious joint deformities or  effusions. Extremities: No clubbing or cyanosis. No pitting edema.  Distal pedal pulses are 2+ bilaterally. Radial cath site with mild ecchymosis. No evidence of a hematoma.  Neuro: Alert and oriented X 3. Moves all extremities spontaneously. No focal deficits noted. Psych:  Responds to questions appropriately with a normal affect. Skin: No rashes or lesions noted  Wt Readings from Last 3 Encounters:  02/02/22 231 lb 3.2 oz (104.9 kg)  01/27/22 235 lb 14.3 oz (107 kg)  09/05/19 236 lb (107 kg)     Studies/Labs Reviewed:   EKG:  EKG is not ordered today. EKG from 01/28/2022 is  reviewed and shows NSR, HR 64 with no acute ST changes.   Recent Labs: 01/28/2022: BUN 10; Creatinine, Ser 0.67; Hemoglobin 13.9; Platelets 220; Potassium 3.8; Sodium 139   Lipid Panel    Component Value Date/Time   CHOL 185 01/28/2022 0334   TRIG 138 01/28/2022 0334   HDL 31 (L) 01/28/2022 0334   CHOLHDL 6.0 01/28/2022 0334   VLDL 28 01/28/2022 0334   LDLCALC 126 (H) 01/28/2022 0334    Additional studies/ records that were reviewed today include:   LHC: 01/27/2022 Ost RCA lesion is 30% stenosed.   Prox RCA to Mid RCA lesion is 30% stenosed.   Prox Cx to Mid Cx lesion is 99% stenosed.   Prox LAD lesion is 30% stenosed.   Mid LAD lesion is 40% stenosed.   1st Mrg lesion is 60% stenosed.   2nd Mrg lesion is 99% stenosed.   A drug-eluting stent was successfully placed using a SYNERGY XD [id number].   Post intervention, there is a 0% residual stenosis.   The left ventricular systolic function is normal.   LV end diastolic pressure is normal.   The left ventricular ejection fraction is 50-55% by visual estimate.   There is no mitral valve regurgitation.   Mild non-obstructive disease in the proximal and mid LAD Severe mid Circumflex stenosis. There are two small caliber obtuse marginal branches. The first obtuse marginal branch has moderate stenosis. The second obtuse marginal branch has severe ostial  stenosis. Both of these branches are too small for PCI.  Successful PTCA/DES x 1 mid Circumflex The RCA is a large dominant vessel with mild non-obstructive disease.  LV systolic function appears normal but poor opacification of the LV cavity with contrast.    Recommendations: Will monitor overnight. Plan for DAPT with ASA and Brilinta for one year. Will start high intensity statin. Continue beta blocker. Echo tomorrow.     Echocardiogram: 01/28/2022 IMPRESSIONS     1. Left ventricular ejection fraction, by estimation, is 60 to 65%. The  left ventricle has normal function. The left ventricle has no regional  wall motion abnormalities. There is mild left ventricular hypertrophy.  Left ventricular diastolic parameters  were normal.   2. Right ventricular systolic function is normal. The right ventricular  size is normal. There is normal pulmonary artery systolic pressure. The  estimated right ventricular systolic pressure is 46.5 mmHg.   3. The mitral valve is normal in structure. Mild mitral valve  regurgitation. No evidence of mitral stenosis.   4. The aortic valve is grossly normal. Aortic valve regurgitation is not  visualized. No aortic stenosis is present.   5. The inferior vena cava is normal in size with greater than 50%  respiratory variability, suggesting right atrial pressure of 3 mmHg.   Assessment:    1. Coronary artery disease involving native coronary artery of native heart without angina pectoris   2. Essential hypertension   3. Hyperlipidemia LDL goal <70   4. Statin intolerance      Plan:   In order of problems listed above:  1. CAD - He is s/p recent NSTEMI and received a DES to the mid-LCx and medical management was recommended of the two small caliber OM branches as they were both too small for PCI. This was reviewed with the patient and his wife today.   - He continues to progress well at home and denies any recent anginal symptoms.   - Continue current  medical therapy with ASA 81 mg daily,  Brilinta 90 mg twice daily and Lopressor 25 mg twice daily. Will plan for likely initiation of PCSK9 inhibitor therapy given his prior intolerances to statins and Zetia.    2. HTN - His blood pressure is well-controlled at 130/68 during today's visit. Continue current medical therapy with Losartan 50 mg daily and Lopressor 25 mg twice daily.   3. HLD - FLP earlier this month showed total cholesterol 185, triglycerides 138, HDL 31 and LDL 126. He has been intolerant to Atorvastatin, Rosuvastatin, Pravastatin and Zetia due to myalgias. We discussed options today and will refer back to the [facility] for consideration of PCSK9 inhibitor therapy.    Medication Adjustments/Labs and Tests Ordered: Current medicines are reviewed at length with the patient today.  Concerns regarding medicines are outlined above.  Medication changes, Labs and Tests ordered today are listed in the Patient Instructions below. Patient Instructions  Medication Instructions:  Your physician recommends that you continue on your current medications as directed. Please refer to the Current Medication list given to you today.  *If you need a refill on your cardiac medications before your next appointment, please call your pharmacy*   Lab Work: NONE   If you have labs (blood work) drawn today and your tests are completely normal, you will receive your results only by: [place] (if you have MyChart) OR A paper copy in the mail If you have any lab test that is abnormal or we need to change your treatment, we will call you to review the results.   Testing/Procedures: NONE    Follow-Up: At [facility], you and your health needs are our priority.  As part of our continuing mission to provide you with exceptional heart care, we have created designated Provider Care Teams.  These Care Teams include your primary Cardiologist (physician) and Advanced Practice Providers (APPs -   Physician Assistants and Nurse Practitioners) who all work together to provide you with the care you need, when you need it.  We recommend signing up for the patient portal called "MyChart".  Sign up information is provided on this After Visit Summary.  MyChart is used to connect with patients for Virtual Visits (Telemedicine).  Patients are able to view lab/test results, encounter notes, upcoming appointments, etc.  Non-urgent messages can be sent to your provider as well.   To learn more about what you can do with MyChart, go to [email or web address].    Your next appointment:   3-4 month(s)  The format for your next appointment:   In Person  Provider:   Eleonore Chiquito, MD      Other Instructions Thank you for choosing [facility]!    Important Information About Sugar         Signed, Erma Heritage, PA-C  02/02/2022 5:32 PM    [place] [address] [place], [place] [address] Phone: [phone] Fax: [phone]

## 2022-02-02 NOTE — Patient Instructions (Signed)
Medication Instructions:  Your physician recommends that you continue on your current medications as directed. Please refer to the Current Medication list given to you today.  *If you need a refill on your cardiac medications before your next appointment, please call your pharmacy*   Lab Work: NONE   If you have labs (blood work) drawn today and your tests are completely normal, you will receive your results only by: [place] (if you have MyChart) OR A paper copy in the mail If you have any lab test that is abnormal or we need to change your treatment, we will call you to review the results.   Testing/Procedures: NONE    Follow-Up: At [facility], you and your health needs are our priority.  As part of our continuing mission to provide you with exceptional heart care, we have created designated Provider Care Teams.  These Care Teams include your primary Cardiologist (physician) and Advanced Practice Providers (APPs -  Physician Assistants and Nurse Practitioners) who all work together to provide you with the care you need, when you need it.  We recommend signing up for the patient portal called "MyChart".  Sign up information is provided on this After Visit Summary.  MyChart is used to connect with patients for Virtual Visits (Telemedicine).  Patients are able to view lab/test results, encounter notes, upcoming appointments, etc.  Non-urgent messages can be sent to your provider as well.   To learn more about what you can do with MyChart, go to [email or web address].    Your next appointment:   3-4 month(s)  The format for your next appointment:   In Person  Provider:   Eleonore Chiquito, MD      Other Instructions Thank you for choosing [facility]!    Important Information About Sugar

## 2022-02-07 DIAGNOSIS — M5416 Radiculopathy, lumbar region: Secondary | ICD-10-CM | POA: Diagnosis not present

## 2022-02-15 ENCOUNTER — Telehealth: Payer: Self-pay

## 2022-02-15 NOTE — Telephone Encounter (Signed)
   As discussed during his office visit, he did have residual disease along some small vessels which were too small for stents and they can cause pain at times. If this becomes a recurrent issue, he should make [place] aware as we may need to further adjust medical therapy.  Signed, Erma Heritage, PA-C 02/15/2022, 4:33 PM

## 2022-02-15 NOTE — Telephone Encounter (Signed)
-----   Message from Erma Heritage, [place] sent at 02/10/2022  7:40 AM EDT ----- Please let the patient know I received a message from his Orthopedist regarding the use of Aleve and Brilinta. He should try to limit the use of Aleve as able given the use of ASA and Brilinta. Tylenol would be a safer option to Aleve if able to take.   Thanks,  [place]     ----- Message ----- From: Wonda Horner, CMA Sent: 02/08/2022   8:28 AM EDT To: Erma Heritage, PA-C

## 2022-02-15 NOTE — Telephone Encounter (Signed)
Patient notified and verbalized understanding. Pt stated that he had cp at church on Sunday. CP came and went, but when he got home he took  SL Nitro 0.4 mg tablet and pain went away. Pt stated that he had stents put in and thought that he would not be having any more CP, so this concerned him. I will fwd to provider for review.   Please advise.

## 2022-02-16 NOTE — Telephone Encounter (Signed)
Patient notified and verbalized understanding. Patient had no other questions or concerns at this time.  

## 2022-02-25 ENCOUNTER — Ambulatory Visit: Payer: Medicare Other | Attending: Cardiology | Admitting: Pharmacist Clinician (PhC)/ Clinical Pharmacy Specialist

## 2022-02-25 ENCOUNTER — Encounter: Payer: Self-pay | Admitting: Pharmacist Clinician (PhC)/ Clinical Pharmacy Specialist

## 2022-02-25 DIAGNOSIS — E785 Hyperlipidemia, unspecified: Secondary | ICD-10-CM

## 2022-02-25 DIAGNOSIS — I214 Non-ST elevation (NSTEMI) myocardial infarction: Secondary | ICD-10-CM | POA: Diagnosis not present

## 2022-02-25 NOTE — Progress Notes (Signed)
02/25/2022 Robert Mooney [birth year] [id number]   HPI:  Robert Mooney is a 71 y.o. male patient of Dr Audie Box, who presents today for a lipid clinic evaluation.  See pertinent past medical history below.  Patient initially presented to ED after a week of chest discomfort and found to have elevated troponin levels in ED at 370.   He was transferred to the cath lab, where he was found to have NSTEMI with stenosis of LCx requiring PCI with DES.   He was discharged on DAPT with Brilinta and aspirin, and asked to follow up with CVRR clinic due to history of statin intolerance.    He is in the office today, with his wife, for lipid discussion.  He has many questions.  His first concern is that he was told not to take Aleve, but notes it is the only thing that helps with his chronic back pain.  Secondly, he is worried about the cost of Brilinta, and thirdly he feels the metoprolol is causing him to have GI issues (upset stomach, diarrhea) as well as increasing his blood sugars.    Past Medical History: CAD NSTEMI, DES to mid LCx  HTN Controlled on   DM2 8/23 A1c 5.9 (as high as 6.7 in 2021) - on metformin   Current Medications: none  Cholesterol Goals: LDL < 55   Intolerant/previously tried: atorvastatin, rosuvastatin, pravastatin, ezetimibe - all caused myalgias  Diet: eats more home cooked foods, has eggs and [place] sausage for breakfast most mornings,   Labs: 01/28/22:  TC 185, TG 138, HDL 31,LDL 126   Current Outpatient Medications  Medication Sig Dispense Refill   ACCU-CHEK AVIVA PLUS test strip TEST BLOOD SUGAR ONCE A DAY  11   acetaminophen (TYLENOL) 650 MG CR tablet Take 650 mg by mouth in the morning and at bedtime.     aspirin EC 81 MG tablet Take 1 tablet (81 mg total) by mouth daily. Swallow whole. 90 tablet 2   Cholecalciferol (VITAMIN D3) 125 MCG (5000 UT) CAPS Take 1 capsule by mouth daily.     diphenhydrAMINE (BENADRYL) 25 mg capsule Take 25 mg by mouth at bedtime as  needed for allergies, sleep or itching.     gabapentin (NEURONTIN) 600 MG tablet Take 300 mg by mouth 2 (two) times daily. 1/2 tablet ('300mg'$ ) twice daily     Garlic 6761 MG CAPS Take 1,000 mg by mouth daily.     losartan (COZAAR) 50 MG tablet Take 50 mg by mouth daily.     metFORMIN (GLUCOPHAGE) 500 MG tablet Take 500 mg by mouth 2 (two) times daily.  1   metoprolol tartrate (LOPRESSOR) 25 MG tablet Take 1 tablet (25 mg total) by mouth 2 (two) times daily. 180 tablet 2   naproxen sodium (ALEVE) 220 MG tablet Take 220 mg by mouth 2 (two) times daily with a meal.     ticagrelor (BRILINTA) 90 MG TABS tablet Take 1 tablet (90 mg total) by mouth 2 (two) times daily. 180 tablet 2   nitroGLYCERIN (NITROSTAT) 0.4 MG SL tablet Place 1 tablet (0.4 mg total) under the tongue every 5 (five) minutes as needed for chest pain. 25 tablet 2   No current facility-administered medications for this visit.    Allergies  Allergen Reactions   Atorvastatin Calcium     Other reaction(s): myalgias   Ezetimibe     Other reaction(s): myalgias   Pravastatin Sodium     Other reaction(s): myalgias   Rosuvastatin Calcium  Other reaction(s): myalgias    Past Medical History:  Diagnosis Date   Anxiety    CAD (coronary artery disease)    a. s/p NSTEMI in 01/2022 with DES to mid-LCx   Diabetes ([place])    High cholesterol    Hypertension     There were no vitals taken for this visit.   HLD (hyperlipidemia) Patient with hyperlipidemia and ASCVD, not at LDL goal and intolerant to multiple statin drugs.  Reviewed options for lowering LDL cholesterol, including PCSK-9 inhibitors, bempedoic acid and inclisiran.  Discussed mechanisms of action, dosing, side effects and potential decreases in LDL cholesterol.  Also reviewed cost information and potential options for patient assistance.  Answered all patient questions.  Based on this information, patient would prefer to start PCSK9 inhibitor.  Concern for cost, as  patient has already noted he cannot afford Brilinta copays.  Will have him fill out patient assistance for [employer].  Once PA has been approved, will submit paperwork to get no cost medication.     NSTEMI (non-ST elevated myocardial infarction) [facility]) Patient concerned about cost of Brilinta, notes that it is cost prohibitive and worries that if it throws him into the coverage gap, all his medications will go up in price.  Gave patient assistance form for him to sign, will pass on the Dr. Kathalene Frames RN for follow up.   Advised patient that we prefer he not use Aleve on daily basis now that he has has NSTEMI.  Gave suggestions for use of Aleve 2-3 days in a row, then stop for at least 2-3 weeks.  Can use acetaminophen 650 mg bid-tid as well as topical lidocaine patches, Voltaren gel and capsaicin.    Agree that beta blockers will sometimes cause increase in blood sugar readings, however his most recent A1c was 5.9 and explained that we have to look at risk/benefit, and that a slight increase in his glucose is worth the benefit of having a beta blocker after MI.     Tommy Medal PharmD CPP [facility] [address] [place], [place] [address] [phone]

## 2022-02-25 NOTE — Assessment & Plan Note (Addendum)
Patient concerned about cost of Brilinta, notes that it is cost prohibitive and worries that if it throws him into the coverage gap, all his medications will go up in price.  Gave patient assistance form for him to sign, will pass on the Dr. Kathalene Frames RN for follow up.   Advised patient that we prefer he not use Aleve on daily basis now that he has has NSTEMI.  Gave suggestions for use of Aleve 2-3 days in a row, then stop for at least 2-3 weeks.  Can use acetaminophen 650 mg bid-tid as well as topical lidocaine patches, Voltaren gel and capsaicin.    Agree that beta blockers will sometimes cause increase in blood sugar readings, however his most recent A1c was 5.9 and explained that we have to look at risk/benefit, and that a slight increase in his glucose is worth the benefit of having a beta blocker after MI.

## 2022-02-25 NOTE — Patient Instructions (Signed)
Your Results:             Your most recent labs Goal  Total Cholesterol 185 < 200  Triglycerides 138 < 150  HDL (happy/good cholesterol) 31 > 40  LDL (lousy/bad cholesterol 126 < 55   Medication changes:  We will start the process to get Repatha covered by your insurance.  Once the prior authorization is complete, we will call you to let you know and confirm pharmacy information.   You will take one injection every 14 days  Lab orders:  We want to repeat labs after 2-3 months.  We will send you a lab order to remind you once we get closer to that time.    Patient Assistance:  The [employer] foundation offers assistance to help pay for medication copays.  They will cover copays for all cholesterol lowering meds, including statins, fibrates, omega-3 oils, ezetimibe, Repatha, Praluent, Nexletol, Nexlizet.  The cards are usually good for $2,500 or 12 months, whichever comes first. Go to healthwellfoundation.org Click on "Apply Now" Answer questions as to whom is applying (patient or representative) Your disease fund will be "hypercholesterolemia - Medicare access" They will ask questions about finances and which medications you are taking for cholesterol When you submit, the approval is usually within minutes.  You will need to print the card information from the site You will need to show this information to your pharmacy, they will bill your Medicare Part D plan first -then bill [employer] --for the copay.   You can also call them at [phone], although the hold times can be quite long.   Thank you for choosing [facility]

## 2022-02-25 NOTE — Assessment & Plan Note (Signed)
Patient with hyperlipidemia and ASCVD, not at LDL goal and intolerant to multiple statin drugs.  Reviewed options for lowering LDL cholesterol, including PCSK-9 inhibitors, bempedoic acid and inclisiran.  Discussed mechanisms of action, dosing, side effects and potential decreases in LDL cholesterol.  Also reviewed cost information and potential options for patient assistance.  Answered all patient questions.  Based on this information, patient would prefer to start PCSK9 inhibitor.  Concern for cost, as patient has already noted he cannot afford Brilinta copays.  Will have him fill out patient assistance for [employer].  Once PA has been approved, will submit paperwork to get no cost medication.

## 2022-03-02 DIAGNOSIS — T466X5A Adverse effect of antihyperlipidemic and antiarteriosclerotic drugs, initial encounter: Secondary | ICD-10-CM | POA: Diagnosis not present

## 2022-03-02 DIAGNOSIS — I25118 Atherosclerotic heart disease of native coronary artery with other forms of angina pectoris: Secondary | ICD-10-CM | POA: Diagnosis not present

## 2022-03-02 DIAGNOSIS — I10 Essential (primary) hypertension: Secondary | ICD-10-CM | POA: Diagnosis not present

## 2022-03-02 DIAGNOSIS — E785 Hyperlipidemia, unspecified: Secondary | ICD-10-CM | POA: Diagnosis not present

## 2022-03-02 DIAGNOSIS — E1169 Type 2 diabetes mellitus with other specified complication: Secondary | ICD-10-CM | POA: Diagnosis not present

## 2022-03-11 DIAGNOSIS — L570 Actinic keratosis: Secondary | ICD-10-CM | POA: Diagnosis not present

## 2022-03-11 DIAGNOSIS — C44519 Basal cell carcinoma of skin of other part of trunk: Secondary | ICD-10-CM | POA: Diagnosis not present

## 2022-03-11 DIAGNOSIS — X32XXXD Exposure to sunlight, subsequent encounter: Secondary | ICD-10-CM | POA: Diagnosis not present

## 2022-03-11 DIAGNOSIS — L820 Inflamed seborrheic keratosis: Secondary | ICD-10-CM | POA: Diagnosis not present

## 2022-04-16 DIAGNOSIS — M25561 Pain in right knee: Secondary | ICD-10-CM | POA: Diagnosis not present

## 2022-04-16 DIAGNOSIS — M25562 Pain in left knee: Secondary | ICD-10-CM | POA: Diagnosis not present

## 2022-05-06 NOTE — Progress Notes (Signed)
Cardiology Clinic Note   Patient Name: Robert Mooney Date of Encounter: 05/07/2022  Primary Care Provider:  Ramiro Harvest, PA-C Primary Cardiologist:  Robert Field, MD  Patient Profile    71 year old male with history of hypertension, hyperlipidemia, type 2 diabetes, with hospitalization at [facility], ED on 01/27/2022 due to intermittent chest discomfort and was found to have an NSTEMI with elevated troponins up to 417.  The patient underwent cardiac catheterization revealing severe stenosis along the mid circumflex treated with drug-eluting stent.  He was also found to have 2 small caliber OM branches which were too small for intervention.    He is was also found to have nonobstructive disease in LAD and RCA and medical management was recommended.  He followed up with Robert Mooney, physician assistant on 02/02/2022 and was overall doing well tolerating Brilinta and aspirin, and was recommended to pharmacy for PCSK9 inhibition therapy as he had allergies to statins.  Past Medical History    Past Medical History:  Diagnosis Date   Anxiety    CAD (coronary artery disease)    a. s/p NSTEMI in 01/2022 with DES to mid-LCx   Diabetes ([place])    High cholesterol    Hypertension    Past Surgical History:  Procedure Laterality Date   CORONARY STENT INTERVENTION N/A 01/27/2022   Procedure: CORONARY STENT INTERVENTION;  Surgeon: Burnell Blanks, MD;  Location: [place] [facility];  Service: Cardiovascular;  Laterality: N/A;   LEFT HEART CATH AND CORONARY ANGIOGRAPHY N/A 01/27/2022   Procedure: LEFT HEART CATH AND CORONARY ANGIOGRAPHY;  Surgeon: Burnell Blanks, MD;  Location: [place] [facility];  Service: Cardiovascular;  Laterality: N/A;   ROTATOR CUFF REPAIR Right 2008    Allergies  Allergies  Allergen Reactions   Atorvastatin Calcium     Other reaction(s): myalgias   Ezetimibe     Other reaction(s): myalgias   Pravastatin Sodium     Other reaction(s):  myalgias   Rosuvastatin Calcium     Other reaction(s): myalgias    History of Present Illness    Robert Mooney presents today for ongoing assessment and management of coronary artery disease, hypertension, hyperlipidemia.  Last seen in the office on 02/02/2022 posthospitalization after being admitted for NSTEMI, with catheterization revealing severe stenosis along the mid left circumflex requiring PCI, with nonobstructive disease along the LAD and RCA, and 2 small caliber OM branches which were severely stenosed but too small for PCI.  He is now on PCSK9 inhibition therapy.  He has been seen today by Pharm.D. for continued management of Repatha.  His main complaint today is dyspnea on exertion.  He states she is trying to lose weight and become more active and he is walking his dog walking in the yard, or walking up and down his driveway to get the mail and is often short of breath.  He denies any resting short of breath but has noticed that the shortness of breath has gotten progressively worse since being started on medication regimen post MI.  He also states he occasionally has a "sharp" chest discomfort feeling on the right side of his chest reminiscent of his recent MI.  He is uncertain if it is gas, muscle, or angina, as this is where the pain was located when he was initially seen for NSTEMI.  He does take nitroglycerin about once a week sublingual and the pain subsides immediately.   Home Medications    Current Outpatient Medications  Medication Sig Dispense  Refill   ACCU-CHEK AVIVA PLUS test strip TEST BLOOD SUGAR ONCE A DAY  11   acetaminophen (TYLENOL) 650 MG CR tablet Take 650 mg by mouth in the morning and at bedtime.     aspirin EC 81 MG tablet Take 1 tablet (81 mg total) by mouth daily. Swallow whole. 90 tablet 2   Cholecalciferol (VITAMIN D3) 125 MCG (5000 UT) CAPS Take 1 capsule by mouth daily.     clopidogrel (PLAVIX) 75 MG tablet Take 1 tablet (75 mg total) by mouth daily. 30  tablet 0   diphenhydrAMINE (BENADRYL) 25 mg capsule Take 25 mg by mouth at bedtime as needed for allergies, sleep or itching.     gabapentin (NEURONTIN) 600 MG tablet Take 300 mg by mouth 2 (two) times daily. 1/2 tablet ('300mg'$ ) twice daily     Garlic 1761 MG CAPS Take 1,000 mg by mouth daily.     losartan (COZAAR) 50 MG tablet Take 50 mg by mouth daily.     metFORMIN (GLUCOPHAGE) 500 MG tablet Take 500 mg by mouth 2 (two) times daily.  1   metoprolol tartrate (LOPRESSOR) 25 MG tablet Take 1 tablet (25 mg total) by mouth 2 (two) times daily. 180 tablet 2   predniSONE (DELTASONE) 10 MG tablet PLEASE SEE ATTACHED FOR DETAILED DIRECTIONS     naproxen sodium (ALEVE) 220 MG tablet Take 220 mg by mouth 2 (two) times daily with a meal. (Patient not taking: Reported on 05/07/2022)     nitroGLYCERIN (NITROSTAT) 0.4 MG SL tablet Place 1 tablet (0.4 mg total) under the tongue every 5 (five) minutes as needed for chest pain. 25 tablet 2   No current facility-administered medications for this visit.     Family History    Family History  Problem Relation Age of Onset   Neuropathy Neg Hx    He indicated that his mother is deceased. He indicated that his father is deceased. He indicated that the status of his neg hx is unknown.  Social History    Social History   Socioeconomic History   Marital status: Married    Spouse name: Enid Derry    Number of children: 0   Years of education: 12   Highest education level: Not on file  Occupational History   Occupation: Retired  Tobacco Use   Smoking status: Never   Smokeless tobacco: Never  Vaping Use   Vaping Use: Never used  Substance and Sexual Activity   Alcohol use: No    Alcohol/week: 0.0 standard drinks of alcohol   Drug use: No   Sexual activity: Not on file  Other Topics Concern   Not on file  Social History Narrative   Lives with wife, Enid Derry.   Caffeine use: Drinks coffee (2 cups per day)   Social Determinants of Health   Financial  Resource Strain: Not on file  Food Insecurity: Not on file  Transportation Needs: Not on file  Physical Activity: Not on file  Stress: Not on file  Social Connections: Not on file  Intimate Partner Violence: Not on file     Review of Systems    General:  No chills, fever, night sweats or weight changes.  Cardiovascular:  No chest pain, positive for dyspnea on exertion, edema, orthopnea, palpitations, paroxysmal nocturnal dyspnea. Dermatological: No rash, lesions/masses Respiratory: No cough, dyspnea Urologic: No hematuria, dysuria Abdominal:   No nausea, vomiting, diarrhea, bright red blood per rectum, melena, or hematemesis Neurologic:  No visual changes, wkns, changes in mental  status. All other systems reviewed and are otherwise negative except as noted above.     Physical Exam    VS:  BP (!) 148/68 (BP Location: Left Arm, Patient Position: Sitting, Cuff Size: Large)   Pulse 68   Ht '5\' 8"'$  (1.727 m)   Wt 231 lb 6.4 oz (105 kg)   SpO2 96%   BMI 35.18 kg/m  , BMI Body mass index is 35.18 kg/m.     GEN: Well nourished, well developed, in no acute distress. HEENT: normal. Neck: Supple, no JVD, carotid bruits, or masses. Cardiac: RRR, no murmurs, rubs, or gallops. No clubbing, cyanosis, edema.  Radials/DP/PT 2+ and equal bilaterally.  Respiratory:  Respirations regular and unlabored, clear to auscultation bilaterally. GI: Soft, nontender, nondistended, BS + x 4.  Central obesity MS: no deformity or atrophy. Skin: warm and dry, no rash. Neuro:  Strength and sensation are intact. Psych: Normal affect.  Accessory Clinical Findings    ECG personally reviewed by me today-not completed this office visit.  Lab Results  Component Value Date   WBC 10.5 01/28/2022   HGB 13.9 01/28/2022   HCT 41.4 01/28/2022   MCV 92.4 01/28/2022   PLT 220 01/28/2022   Lab Results  Component Value Date   CREATININE 0.67 01/28/2022   BUN 10 01/28/2022   NA 139 01/28/2022   K 3.8  01/28/2022   CL 107 01/28/2022   CO2 23 01/28/2022   Lab Results  Component Value Date   ALT 26 09/05/2019   AST 22 09/05/2019   ALKPHOS 42 09/05/2019   BILITOT 0.9 09/05/2019   Lab Results  Component Value Date   CHOL 185 01/28/2022   HDL 31 (L) 01/28/2022   LDLCALC 126 (H) 01/28/2022   TRIG 138 01/28/2022   CHOLHDL 6.0 01/28/2022    Lab Results  Component Value Date   HGBA1C 5.9 (H) 01/28/2022    Review of Prior Studies: ?? 01/27/2022 Ost RCA lesion is 30% stenosed.   Prox RCA to Mid RCA lesion is 30% stenosed.   Prox Cx to Mid Cx lesion is 99% stenosed.   Prox LAD lesion is 30% stenosed.   Mid LAD lesion is 40% stenosed.   1st Mrg lesion is 60% stenosed.   2nd Mrg lesion is 99% stenosed.   A drug-eluting stent was successfully placed using a SYNERGY XD [id number].   Post intervention, there is a 0% residual stenosis.   The left ventricular systolic function is normal.   LV end diastolic pressure is normal.   The left ventricular ejection fraction is 50-55% by visual estimate.   There is no mitral valve regurgitation.   1. Mild non-obstructive disease in the proximal and mid LAD 2. Severe mid Circumflex stenosis. There are two small caliber obtuse marginal branches. The first obtuse marginal branch has moderate stenosis. The second obtuse marginal branch has severe ostial stenosis. Both of these branches are too small for PCI.  3. Successful PTCA/DES x 1 mid Circumflex 4. The RCA is a large dominant vessel with mild non-obstructive disease.  5. LV systolic function appears normal but poor opacification of the LV cavity with contrast.    Recommendations: Will monitor overnight. Plan for DAPT with ASA and Brilinta for one year. Will start high intensity statin. Continue beta blocker. Echo tomorrow.      Echocardiogram: 01/28/2022 IMPRESSIONS     1. Left ventricular ejection fraction, by estimation, is 60 to 65%. The  left ventricle has normal function. The left  ventricle has no regional  wall motion abnormalities. There is mild left ventricular hypertrophy.  Left ventricular diastolic parameters  were normal.   2. Right ventricular systolic function is normal. The right ventricular  size is normal. There is normal pulmonary artery systolic pressure. The  estimated right ventricular systolic pressure is 96.7 mmHg.   3. The mitral valve is normal in structure. Mild mitral valve  regurgitation. No evidence of mitral stenosis.   4. The aortic valve is grossly normal. Aortic valve regurgitation is not  visualized. No aortic stenosis is present.   5. The inferior vena cava is normal in size with greater than 50%  respiratory variability, suggesting right atrial pressure of 3 mmHg.      Assessment & Plan   1.  Coronary artery disease: NSTEMI in August 2023.  Severe stenosis of the mid circumflex treated with a DES with 2 small caliber OM branch is too small for intervention.  He continues to have sharp chest discomfort on the right side which she is uncertain is related to angina versus gas versus muscle.  Nitroglycerin relieves but he only takes this once a week.  He has also been complaining of dyspnea which has been occurring since leaving hospital in August 2023.  His wife's who is with him states that the dyspnea continues.  Uncertain if Brilinta may be causing this, as it has been 5 months, but his wife did remark that his breathing status did change significantly after being placed on this medication on discharge.  I will discontinue Brilinta and start him on Plavix 75 mg daily after 300 mg loading.  I have given him extensive instructions to take 300 mg on the first day and then 75 mg once a day thereafter.  If breathing symptoms do not improve after changing to Plavix, he will have the option of returning to Brilinta remaining on Plavix.  I would prefer he stay on Brilinta.  May need to have him seen by pulmonary or plan for PFTs.  2.   Hypercholesterolemia: Remains on PCSK9 inhibition with Repatha, followed by pharmacy who has seen him today during this office visit.  3.  Hypertension: Blood pressure is not well controlled on current medication regimen.  148/68 is not optimal.  We will see him again on next office visit at which time if blood pressure remains elevated may adjust medications by increasing losartan to 100 mg daily from 50 mg daily.  4.  Type 2 diabetes: Consider adding SGLT inhibitor for cardiovascular protective qualities.  We will discuss further on next office visit.  5.  Metabolic syndrome: I have talked with him about increasing exercise, weight loss, and removed full of carbohydrates such as candy, cookies, and cake which she states that he continues to eat.  Will be important for him to keep his blood sugar well controlled and to lose weight for cardiovascular benefits and overall health.  This may also improve his breathing status with exertion.  I have explained this in detail to the patient who verbalizes understanding.  He is due to see his primary care physician in March 2024.  May need to consider talking about SGLT inhibition injections to assist with weight loss due to his multiple cardiovascular risk factors, and known CAD.    Current medicines are reviewed at length with the patient today.  I have spent 30 min's  dedicated to the care of this patient on the date of this encounter to include pre-visit review of records, assessment,  management and diagnostic testing,with shared decision making. Signed, Phill Myron. West Pugh, ANP, AACC   05/07/2022 1:14 PM      Office [phone] Fax [phone]  Notice: This dictation was prepared with Dragon dictation along with smaller phrase technology. Any transcriptional errors that result from this process are unintentional and may not be corrected upon review. [place]

## 2022-05-07 ENCOUNTER — Ambulatory Visit: Payer: Medicare Other | Attending: Student | Admitting: Adult Health

## 2022-05-07 ENCOUNTER — Ambulatory Visit: Payer: Medicare Other | Admitting: Student

## 2022-05-07 ENCOUNTER — Encounter: Payer: Self-pay | Admitting: Adult Health

## 2022-05-07 ENCOUNTER — Other Ambulatory Visit: Payer: Self-pay | Admitting: Pharmacist Clinician (PhC)/ Clinical Pharmacy Specialist

## 2022-05-07 VITALS — BP 148/68 | HR 68 | Ht 68.0 in | Wt 231.4 lb

## 2022-05-07 DIAGNOSIS — I10 Essential (primary) hypertension: Secondary | ICD-10-CM

## 2022-05-07 DIAGNOSIS — E7800 Pure hypercholesterolemia, unspecified: Secondary | ICD-10-CM | POA: Diagnosis not present

## 2022-05-07 DIAGNOSIS — E8881 Metabolic syndrome: Secondary | ICD-10-CM

## 2022-05-07 DIAGNOSIS — I2510 Atherosclerotic heart disease of native coronary artery without angina pectoris: Secondary | ICD-10-CM | POA: Diagnosis not present

## 2022-05-07 MED ORDER — CLOPIDOGREL BISULFATE 75 MG PO TABS: 75.0000 mg | ORAL_TABLET | Freq: Every day | ORAL | 0 refills | Status: DC

## 2022-05-07 MED ORDER — REPATHA SURECLICK 140 MG/ML ~~LOC~~ SOAJ: 140.0000 mg | SUBCUTANEOUS | 12 refills | Status: DC

## 2022-05-07 MED ORDER — NITROGLYCERIN 0.4 MG SL SUBL: 0.4000 mg | SUBLINGUAL_TABLET | SUBLINGUAL | 2 refills | Status: DC | PRN

## 2022-05-07 NOTE — Patient Instructions (Addendum)
Medication Instructions:  TAKE last dose of Brilinta tonight 05/07/22 START Plavix 75 mg daily tomorrow 05/08/22. TAKE 300 mg (4 tablets) the first day then 1 tablet daily going forward with day 2 (05/09/22) and going  *If you need a refill on your cardiac medications before your next appointment, please call your pharmacy*  Lab Work: NONE ordered at this time of appointment   If you have labs (blood work) drawn today and your tests are completely normal, you will receive your results only by: Rachel (if you have MyChart) OR A paper copy in the mail If you have any lab test that is abnormal or we need to change your treatment, we will call you to review the results.  Testing/Procedures: NONE ordered at this time of appointment   Follow-Up: At [facility], you and your health needs are our priority.  As part of our continuing mission to provide you with exceptional heart care, we have created designated Provider Care Teams.  These Care Teams include your primary Cardiologist (physician) and Advanced Practice Providers (APPs -  Physician Assistants and Nurse Practitioners) who all work together to provide you with the care you need, when you need it.  We recommend signing up for the patient portal called "MyChart".  Sign up information is provided on this After Visit Summary.  MyChart is used to connect with patients for Virtual Visits (Telemedicine).  Patients are able to view lab/test results, encounter notes, upcoming appointments, etc.  Non-urgent messages can be sent to your provider as well.   To learn more about what you can do with MyChart, go to [email or web address].    Your next appointment:   3 month(s)  The format for your next appointment:   In Person  Provider:   Evalina Field, MD     Other Instructions  Important Information About Sugar

## 2022-05-25 ENCOUNTER — Other Ambulatory Visit: Payer: Self-pay | Admitting: *Deleted

## 2022-05-25 MED ORDER — CLOPIDOGREL BISULFATE 75 MG PO TABS: 75.0000 mg | ORAL_TABLET | Freq: Every day | ORAL | 3 refills | Status: DC

## 2022-05-26 ENCOUNTER — Telehealth: Payer: Self-pay | Admitting: Cardiovascular Disease

## 2022-05-26 NOTE — Telephone Encounter (Signed)
Called patient wife, she states they are having the medication shipped to them. They did not need anything else from [place], will call us if they need [place].   Patient wife verbalized understanding.

## 2022-05-26 NOTE — Telephone Encounter (Signed)
Pt c/o medication issue:  1. Name of Medication: clopidogrel (PLAVIX) 75 MG tablet   2. How are you currently taking this medication (dosage and times per day)?   3. Are you having a reaction (difficulty breathing--STAT)?   4. What is your medication issue? Pt states that at the last appt the N.P. told pt they would send prescription for this med for 30 days to local pharmacy and the other 90 day rx to [facility], but [facility]  says they are having an issuing reaching cardiologist office via fax. Pt states that he only has 6 pills left and they would prefer it be sent to [id number] [place], [place] - [address] for a 90 days.

## 2022-05-26 NOTE — Telephone Encounter (Signed)
Spouse stated there was an issue with getting Plavix from [place]. Informed spouse that [facility] received the order yesterday. She will call to ensure patient will be sent medication and contact our clinic if there is an issue.

## 2022-05-26 NOTE — Telephone Encounter (Signed)
FYI--Patient's wife is following up. She states she spoke with [facility] and they are planning to mail patient's prescription.

## 2022-07-01 DIAGNOSIS — E119 Type 2 diabetes mellitus without complications: Secondary | ICD-10-CM | POA: Diagnosis not present

## 2022-07-01 DIAGNOSIS — H40033 Anatomical narrow angle, bilateral: Secondary | ICD-10-CM | POA: Diagnosis not present

## 2022-08-18 DIAGNOSIS — Z789 Other specified health status: Secondary | ICD-10-CM

## 2022-08-18 NOTE — Progress Notes (Signed)
[place] [facility]) [place] Team Statin Quality Measure Assessment  08/18/2022  MERION PETTUS 1951-06-09 [id number]  Per review of chart and payor information, patient has a diagnosis of diabetes but is not currently filling a statin prescription.  This places patient into the Statin Use In Patients with Diabetes (SUPD) measure for CMS.    Patient has prior documentation of statin intolerance. Zetia, Vascepa, and Repatha on file. G72.0 associated to encounter on 05/03/22. If deemed clinically appropriate, please consider associating exclusion code (see options below) at the next counter on 08/20/2022.    The ASCVD Risk score (Arnett DK, et al., 2019) failed to calculate for the following reasons:   The patient has a prior MI or stroke diagnosis      Component Value Date/Time   CHOL 185 01/28/2022 0334   TRIG 138 01/28/2022 0334   HDL 31 (L) 01/28/2022 0334   CHOLHDL 6.0 01/28/2022 0334   VLDL 28 01/28/2022 0334   LDLCALC 126 (H) 01/28/2022 0334    Please consider ONE of the following recommendations:  Initiate high intensity statin Atorvastatin 40 mg once daily, #90, 3 refills   Rosuvastatin 20 mg once daily, #90, 3 refills    Initiate moderate intensity          statin with reduced frequency if prior          statin intolerance 1x weekly, #13, 3 refills   2x weekly, #26, 3 refills   3x weekly, #39, 3 refills    Code for past statin intolerance or  other exclusions (required annually)  Provider Requirements: Associate code during an office visit or telehealth encounter  Drug Induced Myopathy G72.0   Myopathy, unspecified G72.9   Myositis, unspecified M60.9   Rhabdomyolysis M62.82   Cirrhosis of liver K74.69   Prediabetes R73.03   PCOS E28.2   Thank you for allowing [facility] pharmacy to be a part of this patient's care. Kristeen Miss, PharmD Clinical Pharmacist [place] Cell: [phone]

## 2022-08-19 NOTE — Progress Notes (Signed)
Cardiology Clinic Note   Patient Name: Robert Mooney Date of Encounter: 08/20/2022  Primary Care Provider:  Ramiro Harvest, PA-C Primary Cardiologist:  Robert Field, MD  Patient Profile    72 year old male with non-obstructive CAD cardiac catheterization (01/27/2022) revealing severe stenosis along the mid circumflex treated with drug-eluting stent. He was also found to have 2 small caliber OM branches which were too small for intervention; HL on Repatha, metabolic syndrome, Type II diabetes, and obesity.   Past Medical History    Past Medical History:  Diagnosis Date   Anxiety    CAD (coronary artery disease)    a. s/p NSTEMI in 01/2022 with DES to mid-LCx   Diabetes ([place])    High cholesterol    Hypertension    Past Surgical History:  Procedure Laterality Date   CORONARY STENT INTERVENTION N/A 01/27/2022   Procedure: CORONARY STENT INTERVENTION;  Surgeon: Robert Blanks, MD;  Location: [facility];  Service: Cardiovascular;  Laterality: N/A;   LEFT HEART CATH AND CORONARY ANGIOGRAPHY N/A 01/27/2022   Procedure: LEFT HEART CATH AND CORONARY ANGIOGRAPHY;  Surgeon: Robert Blanks, MD;  Location: [facility];  Service: Cardiovascular;  Laterality: N/A;   ROTATOR CUFF REPAIR Right 2008    Allergies  Allergies  Allergen Reactions   Atorvastatin Calcium     Other reaction(s): myalgias   Ezetimibe     Other reaction(s): myalgias   Pravastatin Sodium     Other reaction(s): myalgias   Rosuvastatin Calcium     Other reaction(s): myalgias    History of Present Illness    [name] returns for ongoing assessment and management of CAD as described above, HL followed by pharmacist for PCSK9 therapy, metabolic syndrome and obesity.  He was taken off of Brilinta on last office visit 05/07/2022 due to complaints of dyspnea after 6 months of therapy and changed to clopidogrel.  He was referred to PCP for consideration for SGLT1 inhibitor for weight  loss.    He comes today with 3 main complaints.  He states that he is unhappy with the amount of bruising and bleeding is experiencing especially in his hands from the DAPT, he also complains of ongoing dyspnea, and fatigue.  He has been medically compliant.  He still struggles with his weight and food choices.  He is trying to remain active but over the winter he has put on some weight.  Home Medications    Current Outpatient Medications  Medication Sig Dispense Refill   ACCU-CHEK AVIVA PLUS test strip TEST BLOOD SUGAR ONCE A DAY  11   acetaminophen (TYLENOL) 650 MG CR tablet Take 650 mg by mouth daily.     aspirin EC 81 MG tablet Take 1 tablet (81 mg total) by mouth daily. Swallow whole. 90 tablet 2   Cholecalciferol (VITAMIN D3) 125 MCG (5000 UT) CAPS Take 1 capsule by mouth daily.     clopidogrel (PLAVIX) 75 MG tablet Take 1 tablet (75 mg total) by mouth daily. 90 tablet 3   diphenhydrAMINE (BENADRYL) 25 mg capsule Take 25 mg by mouth at bedtime as needed for allergies, sleep or itching.     Evolocumab (REPATHA SURECLICK) XX123456 MG/ML SOAJ Inject 140 mg into the skin every 14 (fourteen) days. 2 mL 12   gabapentin (NEURONTIN) 600 MG tablet Take 300 mg by mouth 2 (two) times daily. 1 tablet 2 times daily     Garlic 123XX123 MG CAPS Take 1,000 mg by mouth  daily.     losartan (COZAAR) 50 MG tablet Take 50 mg by mouth daily.     metFORMIN (GLUCOPHAGE) 500 MG tablet Take 500 mg by mouth 2 (two) times daily.  1   metoprolol succinate (TOPROL XL) 25 MG 24 hr tablet Take 1 tablet (25 mg total) by mouth every evening. 90 tablet 1   nitroGLYCERIN (NITROSTAT) 0.4 MG SL tablet Place 1 tablet (0.4 mg total) under the tongue every 5 (five) minutes as needed for chest pain. 25 tablet 2   naproxen sodium (ALEVE) 220 MG tablet Take 220 mg by mouth 2 (two) times daily with a meal. (Patient not taking: Reported on 08/20/2022)     predniSONE (DELTASONE) 10 MG tablet PLEASE SEE ATTACHED FOR DETAILED DIRECTIONS (Patient  not taking: Reported on 08/20/2022)     No current facility-administered medications for this visit.     Family History    Family History  Problem Relation Age of Onset   Neuropathy Neg Hx    He indicated that his mother is deceased. He indicated that his father is deceased. He indicated that the status of his neg hx is unknown.  Social History    Social History   Socioeconomic History   Marital status: Married    Spouse name: Robert Mooney    Number of children: 0   Years of education: 12   Highest education level: Not on file  Occupational History   Occupation: Retired  Tobacco Use   Smoking status: Never   Smokeless tobacco: Never  Vaping Use   Vaping Use: Never used  Substance and Sexual Activity   Alcohol use: No    Alcohol/week: 0.0 standard drinks of alcohol   Drug use: No   Sexual activity: Not on file  Other Topics Concern   Not on file  Social History Narrative   Lives with wife, Robert Mooney.   Caffeine use: Drinks coffee (2 cups per day)   Social Determinants of Health   Financial Resource Strain: Not on file  Food Insecurity: Not on file  Transportation Needs: Not on file  Physical Activity: Not on file  Stress: Not on file  Social Connections: Not on file  Intimate Partner Violence: Not on file     Review of Systems    General:  No chills, fever, night sweats or weight changes.  Cardiovascular:  No chest pain, dyspnea on exertion, edema, orthopnea, palpitations, paroxysmal nocturnal dyspnea. Dermatological: No rash, lesions/masses Respiratory: No cough, dyspnea Urologic: No hematuria, dysuria Abdominal:   No nausea, vomiting, diarrhea, bright red blood per rectum, melena, or hematemesis Neurologic:  No visual changes, wkns, changes in mental status. All other systems reviewed and are otherwise negative except as noted above.     Physical Exam    VS:  BP (!) 140/70   Pulse 68   Ht '5\' 8"'$  (1.727 m)   Wt 238 lb (108 kg)   SpO2 98%   BMI 36.19 kg/m   , BMI Body mass index is 36.19 kg/m.     GEN: Well nourished, well developed, in no acute distress.  Central obesity is noted. HEENT: normal. Neck: Supple, no JVD, carotid bruits, or masses. Cardiac: RRR, no murmurs, rubs, or gallops. No clubbing, cyanosis, edema.  Radials/DP/PT 2+ and equal bilaterally.  Respiratory:  Respirations regular and unlabored, clear to auscultation bilaterally. GI: Soft, nontender, nondistended, BS + x 4. MS: no deformity or atrophy.  Areas of bruising, healing cuts.  Skin: warm and dry, no rash. Neuro:  Strength and sensation are intact. Psych: Normal affect.  Accessory Clinical Findings    ECG personally reviewed by me today-not completed this office visit-  Lab Results  Component Value Date   WBC 10.5 01/28/2022   HGB 13.9 01/28/2022   HCT 41.4 01/28/2022   MCV 92.4 01/28/2022   PLT 220 01/28/2022   Lab Results  Component Value Date   CREATININE 0.67 01/28/2022   BUN 10 01/28/2022   NA 139 01/28/2022   K 3.8 01/28/2022   CL 107 01/28/2022   CO2 23 01/28/2022   Lab Results  Component Value Date   ALT 26 09/05/2019   AST 22 09/05/2019   ALKPHOS 42 09/05/2019   BILITOT 0.9 09/05/2019   Lab Results  Component Value Date   CHOL 185 01/28/2022   HDL 31 (L) 01/28/2022   LDLCALC 126 (H) 01/28/2022   TRIG 138 01/28/2022   CHOLHDL 6.0 01/28/2022    Lab Results  Component Value Date   HGBA1C 5.9 (H) 01/28/2022    Review of Prior Studies:  LHC 01/27/2022      Prox RCA to Mid RCA lesion is 30% stenosed.   Prox Cx to Mid Cx lesion is 99% stenosed.   Prox LAD lesion is 30% stenosed.   Mid LAD lesion is 40% stenosed.   1st Mrg lesion is 60% stenosed.   2nd Mrg lesion is 99% stenosed.   A drug-eluting stent was successfully placed using a SYNERGY XD [id number].   Post intervention, there is a 0% residual stenosis.   The left ventricular systolic function is normal.   LV end diastolic pressure is normal.   The left ventricular ejection  fraction is 50-55% by visual estimate.   There is no mitral valve regurgitation.  Echocardiogram 01/28/2022 1. Left ventricular ejection fraction, by estimation, is 60 to 65%. The  left ventricle has normal function. The left ventricle has no regional  wall motion abnormalities. There is mild left ventricular hypertrophy.  Left ventricular diastolic parameters  were normal.   2. Right ventricular systolic function is normal. The right ventricular  size is normal. There is normal pulmonary artery systolic pressure. The  estimated right ventricular systolic pressure is 123456 mmHg.   3. The mitral valve is normal in structure. Mild mitral valve  regurgitation. No evidence of mitral stenosis.   4. The aortic valve is grossly normal. Aortic valve regurgitation is not  visualized. No aortic stenosis is present.   5. The inferior vena cava is normal in size with greater than 50%  respiratory variability, suggesting right atrial pressure of 3 mmHg.   Assessment & Plan   1.  Coronary artery disease: Status post DES to the mid circumflex, with 2 small caliber OM branch is too small for intervention.  He has been complaining of dual antiplatelet side effects concerning bruising and easily bleeding.  I have discussed the need for him to stay on DAPT at least until August 2024 at which time we can consider taking him off of that and having him be only on aspirin.  Concerning his generalized fatigue.  This may be multifactorial in the setting of morbid obesity, inactivity, and possibly beta-blocker therapy.  He states his heart rate never gets much higher than 64 bpm even though he is actively working in his yard.  I am going to decrease his metoprolol to tartrate from 25 mg twice daily to metoprolol succinate 25 mg daily which she will take at at bedtime.  2.  Hyperlipidemia:  Intolerant of statins causing myalgia.  The patient is now on Repatha and is being followed by pharmacy.  Labs will be ordered by his  PCP next month for his annual appointment.  3.  Type 2 diabetes: Followed by PCP.  He will discuss with PCP injectable SGLT inhibition to help him with weight loss which may be helpful in general for cardiovascular health as well as his energy level and stamina.   Current medicines are reviewed at length with the patient today.  I have spent 25 min's  dedicated to the care of this patient on the date of this encounter to include pre-visit review of records, assessment, management and diagnostic testing,with shared decision making. Signed, Phill Myron. West Pugh, ANP, AACC   08/20/2022 2:08 PM      Office [phone] Fax [phone]  Notice: This dictation was prepared with Dragon dictation along with smaller phrase technology. Any transcriptional errors that result from this process are unintentional and may not be corrected upon review.

## 2022-08-20 ENCOUNTER — Ambulatory Visit: Payer: Medicare Other | Attending: Adult Health | Admitting: Adult Health

## 2022-08-20 ENCOUNTER — Encounter: Payer: Self-pay | Admitting: Adult Health

## 2022-08-20 VITALS — BP 140/70 | HR 68 | Ht 68.0 in | Wt 238.0 lb

## 2022-08-20 DIAGNOSIS — M791 Myalgia, unspecified site: Secondary | ICD-10-CM | POA: Diagnosis not present

## 2022-08-20 DIAGNOSIS — T466X5A Adverse effect of antihyperlipidemic and antiarteriosclerotic drugs, initial encounter: Secondary | ICD-10-CM

## 2022-08-20 DIAGNOSIS — E8881 Metabolic syndrome: Secondary | ICD-10-CM | POA: Diagnosis not present

## 2022-08-20 DIAGNOSIS — E7800 Pure hypercholesterolemia, unspecified: Secondary | ICD-10-CM | POA: Diagnosis not present

## 2022-08-20 DIAGNOSIS — I2510 Atherosclerotic heart disease of native coronary artery without angina pectoris: Secondary | ICD-10-CM | POA: Diagnosis not present

## 2022-08-20 DIAGNOSIS — I10 Essential (primary) hypertension: Secondary | ICD-10-CM | POA: Diagnosis not present

## 2022-08-20 MED ORDER — METOPROLOL SUCCINATE ER 25 MG PO TB24: 25.0000 mg | ORAL_TABLET | Freq: Every evening | ORAL | 1 refills | Status: DC

## 2022-08-20 NOTE — Patient Instructions (Signed)
Medication Instructions:  Stop Metoprolol Tartrate. Start Metoprolol Succinate 25 mg ( Take 1 Tablet Every Evening ). *If you need a refill on your cardiac medications before your next appointment, please call your pharmacy*   Lab Work: No Labs If you have labs (blood work) drawn today and your tests are completely normal, you will receive your results only by: [facility] (if you have MyChart) OR A paper copy in the mail If you have any lab test that is abnormal or we need to change your treatment, we will call you to review the results.   Testing/Procedures: No Testing    Follow-Up: At [facility], you and your health needs are our priority.  As part of our continuing mission to provide you with exceptional heart care, we have created designated Provider Care Teams.  These Care Teams include your primary Cardiologist (physician) and Advanced Practice Providers (APPs -  Physician Assistants and Nurse Practitioners) who all work together to provide you with the care you need, when you need it.  We recommend signing up for the patient portal called "MyChart".  Sign up information is provided on this After Visit Summary.  MyChart is used to connect with patients for Virtual Visits (Telemedicine).  Patients are able to view lab/test results, encounter notes, upcoming appointments, etc.  Non-urgent messages can be sent to your provider as well.   To learn more about what you can do with MyChart, go to [email or web address].    Your next appointment:   6 month(s)  Provider:   Evalina Field, MD

## 2022-08-31 DIAGNOSIS — G629 Polyneuropathy, unspecified: Secondary | ICD-10-CM | POA: Diagnosis not present

## 2022-08-31 DIAGNOSIS — M25551 Pain in right hip: Secondary | ICD-10-CM | POA: Diagnosis not present

## 2022-08-31 DIAGNOSIS — E785 Hyperlipidemia, unspecified: Secondary | ICD-10-CM | POA: Diagnosis not present

## 2022-08-31 DIAGNOSIS — E1140 Type 2 diabetes mellitus with diabetic neuropathy, unspecified: Secondary | ICD-10-CM | POA: Diagnosis not present

## 2022-08-31 DIAGNOSIS — I10 Essential (primary) hypertension: Secondary | ICD-10-CM | POA: Diagnosis not present

## 2022-08-31 DIAGNOSIS — E1169 Type 2 diabetes mellitus with other specified complication: Secondary | ICD-10-CM | POA: Diagnosis not present

## 2022-08-31 DIAGNOSIS — I25118 Atherosclerotic heart disease of native coronary artery with other forms of angina pectoris: Secondary | ICD-10-CM | POA: Diagnosis not present

## 2022-08-31 DIAGNOSIS — Z1211 Encounter for screening for malignant neoplasm of colon: Secondary | ICD-10-CM | POA: Diagnosis not present

## 2022-09-14 DIAGNOSIS — M5416 Radiculopathy, lumbar region: Secondary | ICD-10-CM | POA: Diagnosis not present

## 2022-09-23 ENCOUNTER — Ambulatory Visit: Payer: Medicare Other

## 2022-10-06 DIAGNOSIS — M5416 Radiculopathy, lumbar region: Secondary | ICD-10-CM | POA: Diagnosis not present

## 2022-10-06 DIAGNOSIS — M47816 Spondylosis without myelopathy or radiculopathy, lumbar region: Secondary | ICD-10-CM | POA: Diagnosis not present

## 2022-10-20 DIAGNOSIS — M5416 Radiculopathy, lumbar region: Secondary | ICD-10-CM | POA: Diagnosis not present

## 2022-10-29 DIAGNOSIS — M48062 Spinal stenosis, lumbar region with neurogenic claudication: Secondary | ICD-10-CM | POA: Diagnosis not present

## 2022-10-29 DIAGNOSIS — M5416 Radiculopathy, lumbar region: Secondary | ICD-10-CM | POA: Diagnosis not present

## 2022-12-30 ENCOUNTER — Other Ambulatory Visit: Payer: Self-pay

## 2022-12-30 ENCOUNTER — Encounter (HOSPITAL_COMMUNITY): Payer: Self-pay | Admitting: Emergency Medicine

## 2022-12-30 ENCOUNTER — Emergency Department (HOSPITAL_COMMUNITY): Payer: Medicare Other

## 2022-12-30 ENCOUNTER — Emergency Department (HOSPITAL_COMMUNITY)
Admission: EM | Admit: 2022-12-30 | Discharge: 2022-12-30 | Disposition: A | Discharge status: Home / Self Care | Payer: Medicare Other | Attending: Student | Admitting: Student

## 2022-12-30 DIAGNOSIS — Z7902 Long term (current) use of antithrombotics/antiplatelets: Secondary | ICD-10-CM | POA: Insufficient documentation

## 2022-12-30 DIAGNOSIS — E119 Type 2 diabetes mellitus without complications: Secondary | ICD-10-CM | POA: Insufficient documentation

## 2022-12-30 DIAGNOSIS — Z7984 Long term (current) use of oral hypoglycemic drugs: Secondary | ICD-10-CM | POA: Diagnosis not present

## 2022-12-30 DIAGNOSIS — N289 Disorder of kidney and ureter, unspecified: Secondary | ICD-10-CM | POA: Diagnosis not present

## 2022-12-30 DIAGNOSIS — Z7982 Long term (current) use of aspirin: Secondary | ICD-10-CM | POA: Insufficient documentation

## 2022-12-30 DIAGNOSIS — N200 Calculus of kidney: Secondary | ICD-10-CM | POA: Diagnosis not present

## 2022-12-30 DIAGNOSIS — R109 Unspecified abdominal pain: Secondary | ICD-10-CM | POA: Diagnosis present

## 2022-12-30 DIAGNOSIS — K5750 Diverticulosis of both small and large intestine without perforation or abscess without bleeding: Secondary | ICD-10-CM | POA: Diagnosis not present

## 2022-12-30 DIAGNOSIS — N132 Hydronephrosis with renal and ureteral calculous obstruction: Secondary | ICD-10-CM | POA: Diagnosis not present

## 2022-12-30 DIAGNOSIS — I2510 Atherosclerotic heart disease of native coronary artery without angina pectoris: Secondary | ICD-10-CM | POA: Insufficient documentation

## 2022-12-30 DIAGNOSIS — I10 Essential (primary) hypertension: Secondary | ICD-10-CM | POA: Diagnosis not present

## 2022-12-30 DIAGNOSIS — Z79899 Other long term (current) drug therapy: Secondary | ICD-10-CM | POA: Diagnosis not present

## 2022-12-30 DIAGNOSIS — R0689 Other abnormalities of breathing: Secondary | ICD-10-CM | POA: Diagnosis not present

## 2022-12-30 DIAGNOSIS — M549 Dorsalgia, unspecified: Secondary | ICD-10-CM | POA: Diagnosis not present

## 2022-12-30 DIAGNOSIS — N202 Calculus of kidney with calculus of ureter: Secondary | ICD-10-CM | POA: Diagnosis not present

## 2022-12-30 DIAGNOSIS — R531 Weakness: Secondary | ICD-10-CM | POA: Diagnosis not present

## 2022-12-30 LAB — CBC WITH DIFFERENTIAL/PLATELET
Abs Immature Granulocytes: 0.05 10*3/uL (ref 0.00–0.07)
Basophils Absolute: 0 10*3/uL (ref 0.0–0.1)
Basophils Relative: 0 %
Eosinophils Absolute: 0 10*3/uL (ref 0.0–0.5)
Eosinophils Relative: 0 %
HCT: 42.9 % (ref 39.0–52.0)
Hemoglobin: 14.6 g/dL (ref 13.0–17.0)
Immature Granulocytes: 0 %
Lymphocytes Relative: 7 %
Lymphs Abs: 0.8 10*3/uL (ref 0.7–4.0)
MCH: 31.5 pg (ref 26.0–34.0)
MCHC: 34 g/dL (ref 30.0–36.0)
MCV: 92.5 fL (ref 80.0–100.0)
Monocytes Absolute: 1.3 10*3/uL — ABNORMAL HIGH (ref 0.1–1.0)
Monocytes Relative: 11 %
Neutro Abs: 9.8 10*3/uL — ABNORMAL HIGH (ref 1.7–7.7)
Neutrophils Relative %: 82 %
Platelets: 185 10*3/uL (ref 150–400)
RBC: 4.64 MIL/uL (ref 4.22–5.81)
RDW: 12.2 % (ref 11.5–15.5)
WBC: 12 10*3/uL — ABNORMAL HIGH (ref 4.0–10.5)
nRBC: 0 % (ref 0.0–0.2)

## 2022-12-30 LAB — URINALYSIS, ROUTINE W REFLEX MICROSCOPIC
Bacteria, UA: NONE SEEN
Bilirubin Urine: NEGATIVE
Glucose, UA: NEGATIVE mg/dL
Ketones, ur: 20 mg/dL — AB
Leukocytes,Ua: NEGATIVE
Nitrite: NEGATIVE
Protein, ur: 30 mg/dL — AB
Specific Gravity, Urine: 1.023 (ref 1.005–1.030)
pH: 5 (ref 5.0–8.0)

## 2022-12-30 LAB — COMPREHENSIVE METABOLIC PANEL
ALT: 16 U/L (ref 0–44)
AST: 17 U/L (ref 15–41)
Albumin: 4.3 g/dL (ref 3.5–5.0)
Alkaline Phosphatase: 35 U/L — ABNORMAL LOW (ref 38–126)
Anion gap: 9 (ref 5–15)
BUN: 18 mg/dL (ref 8–23)
CO2: 25 mmol/L (ref 22–32)
Calcium: 9.3 mg/dL (ref 8.9–10.3)
Chloride: 102 mmol/L (ref 98–111)
Creatinine, Ser: 0.98 mg/dL (ref 0.61–1.24)
GFR, Estimated: >60 mL/min (ref >60)
Glucose, Bld: 165 mg/dL — ABNORMAL HIGH (ref 70–99)
Potassium: 4 mmol/L (ref 3.5–5.1)
Sodium: 136 mmol/L (ref 135–145)
Total Bilirubin: 0.8 mg/dL (ref 0.3–1.2)
Total Protein: 7.2 g/dL (ref 6.5–8.1)

## 2022-12-30 LAB — LIPASE, BLOOD: Lipase: 34 U/L (ref 11–51)

## 2022-12-30 MED ORDER — MORPHINE SULFATE (PF) 4 MG/ML IV SOLN
4.0000 mg | Freq: Once | INTRAVENOUS | Status: AC
  Administered 2022-12-30: 4 mg via INTRAVENOUS
  Filled 2022-12-30: qty 1

## 2022-12-30 MED ORDER — ONDANSETRON 4 MG PO TBDP: 4.0000 mg | ORAL_TABLET | Freq: Three times a day (TID) | ORAL | 0 refills | Status: DC | PRN

## 2022-12-30 MED ORDER — TAMSULOSIN HCL 0.4 MG PO CAPS: 0.4000 mg | ORAL_CAPSULE | Freq: Every day | ORAL | 0 refills | Status: AC

## 2022-12-30 MED ORDER — LACTATED RINGERS IV BOLUS
1000.0000 mL | Freq: Once | INTRAVENOUS | Status: AC
  Administered 2022-12-30: 1000 mL via INTRAVENOUS

## 2022-12-30 MED ORDER — TAMSULOSIN HCL 0.4 MG PO CAPS
0.4000 mg | ORAL_CAPSULE | Freq: Once | ORAL | Status: AC
  Administered 2022-12-30: 0.4 mg via ORAL
  Filled 2022-12-30: qty 1

## 2022-12-30 MED ORDER — ONDANSETRON HCL 4 MG/2ML IJ SOLN
4.0000 mg | Freq: Once | INTRAMUSCULAR | Status: AC
  Administered 2022-12-30: 4 mg via INTRAVENOUS
  Filled 2022-12-30: qty 2

## 2022-12-30 MED ORDER — NAPROXEN 375 MG PO TABS: 375.0000 mg | ORAL_TABLET | Freq: Two times a day (BID) | ORAL | 0 refills | Status: DC

## 2022-12-30 MED ORDER — KETOROLAC TROMETHAMINE 15 MG/ML IJ SOLN
15.0000 mg | Freq: Once | INTRAMUSCULAR | Status: AC
  Administered 2022-12-30: 15 mg via INTRAMUSCULAR
  Filled 2022-12-30: qty 1

## 2022-12-30 NOTE — ED Provider Notes (Signed)
[facility] AT [facility] Provider Note  CSN: [id number] Arrival date & time: 12/30/22 0831  Chief Complaint(s) Flank Pain  HPI Robert Mooney is a 72 y.o. male with PMH CAD status post DES placement, T2DM, HTN, HLD who presents emergency department for evaluation of left flank pain.  States that symptoms began earlier this morning in the left flank radiating into the groin.  Does endorse associated nausea but no vomiting.  Denies chest pain, shortness of breath, headache, fever or other systemic symptoms.   Past Medical History Past Medical History:  Diagnosis Date   Anxiety    CAD (coronary artery disease)    a. s/p NSTEMI in 01/2022 with DES to mid-LCx   Diabetes (HCC)    High cholesterol    Hypertension    Patient Active Problem List   Diagnosis Date Noted   NSTEMI (non-ST elevated myocardial infarction) (HCC) 01/27/2022   HLD (hyperlipidemia) 01/27/2022   HTN (hypertension) 01/27/2022   Hereditary and idiopathic peripheral neuropathy 04/11/2015   Diabetes (HCC) 04/11/2015   Chronic low back pain with bilateral sciatica 04/11/2015   Spinal stenosis of lumbar region 04/11/2015   Home Medication(s) Prior to Admission medications   Medication Sig Start Date End Date Taking? Authorizing Provider  naproxen (NAPROSYN) 375 MG tablet Take 1 tablet (375 mg total) by mouth 2 (two) times daily. 12/30/22  Yes Miosha Behe, MD  ondansetron (ZOFRAN-ODT) 4 MG disintegrating tablet Take 1 tablet (4 mg total) by mouth every 8 (eight) hours as needed for nausea or vomiting. 12/30/22  Yes Felecity Lemaster, MD  tamsulosin (FLOMAX) 0.4 MG CAPS capsule Take 1 capsule (0.4 mg total) by mouth daily for 7 days. 12/30/22 01/06/23 Yes Gerry Heaphy, MD  ACCU-CHEK AVIVA PLUS test strip TEST BLOOD SUGAR ONCE A DAY 03/31/15   [provider]  acetaminophen (TYLENOL) 650 MG CR tablet Take 650 mg by mouth daily.    [provider]  aspirin EC 81 MG tablet  Take 1 tablet (81 mg total) by mouth daily. Swallow whole. 01/29/22   Arty Baumgartner, NP  Cholecalciferol (VITAMIN D3) 125 MCG (5000 UT) CAPS Take 1 capsule by mouth daily.    [provider]  clopidogrel (PLAVIX) 75 MG tablet Take 1 tablet (75 mg total) by mouth daily. 05/25/22   Jodelle Gross, NP  diphenhydrAMINE (BENADRYL) 25 mg capsule Take 25 mg by mouth at bedtime as needed for allergies, sleep or itching.    [provider]  Evolocumab (REPATHA SURECLICK) 140 MG/ML SOAJ Inject 140 mg into the skin every 14 (fourteen) days. 05/07/22   O'NealRonnald Ramp, MD  gabapentin (NEURONTIN) 600 MG tablet Take 300 mg by mouth 2 (two) times daily. 1 tablet 2 times daily 06/26/19   [provider]  Garlic 1000 MG CAPS Take 1,000 mg by mouth daily.    [provider]  losartan (COZAAR) 50 MG tablet Take 50 mg by mouth daily. 06/26/19   [provider]  metFORMIN (GLUCOPHAGE) 500 MG tablet Take 500 mg by mouth 2 (two) times daily. 01/21/15   [provider]  metoprolol succinate (TOPROL XL) 25 MG 24 hr tablet Take 1 tablet (25 mg total) by mouth every evening. 08/20/22   Jodelle Gross, NP  naproxen sodium (ALEVE) 220 MG tablet Take 220 mg by mouth 2 (two) times daily with a meal. Patient not taking: Reported on 08/20/2022    [provider]  nitroGLYCERIN (NITROSTAT) 0.4 MG SL tablet  Place 1 tablet (0.4 mg total) under the tongue every 5 (five) minutes as needed for chest pain. 05/07/22   Jodelle Gross, NP  predniSONE (DELTASONE) 10 MG tablet PLEASE SEE ATTACHED FOR DETAILED DIRECTIONS Patient not taking: Reported on 08/20/2022 04/17/22   [provider]                                                                                                                                    Past Surgical History Past Surgical History:  Procedure Laterality Date   CORONARY STENT INTERVENTION N/A 01/27/2022   Procedure: CORONARY STENT  INTERVENTION;  Surgeon: Kathleene Hazel, MD;  Location: [facility] INVASIVE CV LAB;  Service: Cardiovascular;  Laterality: N/A;   LEFT HEART CATH AND CORONARY ANGIOGRAPHY N/A 01/27/2022   Procedure: LEFT HEART CATH AND CORONARY ANGIOGRAPHY;  Surgeon: Kathleene Hazel, MD;  Location: [facility] INVASIVE CV LAB;  Service: Cardiovascular;  Laterality: N/A;   ROTATOR CUFF REPAIR Right 2008   Family History Family History  Problem Relation Age of Onset   Neuropathy Neg Hx     Social History Social History   Tobacco Use   Smoking status: Never   Smokeless tobacco: Never  Vaping Use   Vaping status: Never Used  Substance Use Topics   Alcohol use: No    Alcohol/week: 0.0 standard drinks of alcohol   Drug use: No   Allergies Atorvastatin calcium, Ezetimibe, Pravastatin sodium, and Rosuvastatin calcium  Review of Systems Review of Systems  Gastrointestinal:  Positive for abdominal pain and nausea.  Genitourinary:  Positive for flank pain.    Physical Exam Vital Signs  I have reviewed the triage vital signs BP (!) 160/65 (BP Location: Right Arm)   Pulse 68   Temp 98.3 F (36.8 C) (Oral)   Resp 16   Ht 5\' 7"  (1.702 m)   Wt 96.2 kg   SpO2 96%   BMI 33.20 kg/m   Physical Exam Constitutional:      General: He is not in acute distress.    Appearance: Normal appearance.  HENT:     Head: Normocephalic and atraumatic.     Nose: No congestion or rhinorrhea.  Eyes:     General:        Right eye: No discharge.        Left eye: No discharge.     Extraocular Movements: Extraocular movements intact.     Pupils: Pupils are equal, round, and reactive to light.  Cardiovascular:     Rate and Rhythm: Normal rate and regular rhythm.     Heart sounds: No murmur heard. Pulmonary:     Effort: No respiratory distress.     Breath sounds: No wheezing or rales.  Abdominal:     General: There is no distension.     Tenderness: There is abdominal tenderness. There is left CVA tenderness.   Musculoskeletal:  General: Normal range of motion.     Cervical back: Normal range of motion.  Skin:    General: Skin is warm and dry.  Neurological:     General: No focal deficit present.     Mental Status: He is alert.     ED Results and Treatments Labs (all labs ordered are listed, but only abnormal results are displayed) Labs Reviewed  CBC WITH DIFFERENTIAL/PLATELET - Abnormal; Notable for the following components:      Result Value   WBC 12.0 (*)    Neutro Abs 9.8 (*)    Monocytes Absolute 1.3 (*)    All other components within normal limits  COMPREHENSIVE METABOLIC PANEL - Abnormal; Notable for the following components:   Glucose, Bld 165 (*)    Alkaline Phosphatase 35 (*)    All other components within normal limits  URINALYSIS, ROUTINE W REFLEX MICROSCOPIC - Abnormal; Notable for the following components:   Hgb urine dipstick SMALL (*)    Ketones, ur 20 (*)    Protein, ur 30 (*)    All other components within normal limits  LIPASE, BLOOD                                                                                                                          Radiology CT Renal Stone Study  Result Date: 12/30/2022 CLINICAL DATA:  Left lower back pain. EXAM: CT ABDOMEN AND PELVIS WITHOUT CONTRAST TECHNIQUE: Multidetector CT imaging of the abdomen and pelvis was performed following the standard protocol without IV contrast. RADIATION DOSE REDUCTION: This exam was performed according to the departmental dose-optimization program which includes automated exposure control, adjustment of the mA and/or kV according to patient size and/or use of iterative reconstruction technique. COMPARISON:  None Available. FINDINGS: Lower chest: The lung bases are clear. Coronary artery calcifications are noted. The imaged heart is otherwise unremarkable. Hepatobiliary: The liver and gallbladder are unremarkable. There is no biliary ductal dilatation. Pancreas: Unremarkable. Spleen:  Unremarkable. Adrenals/Urinary Tract: The adrenals are unremarkable. There are two nonobstructing left upper pole renal stones measuring approximately 5 mm. There is a punctate stone in the distal left ureter just proximal to the UVJ (2-79, 5-82), and an additional 3 mm stone at the UVJ. There is mild upstream hydroureteronephrosis with asymmetric perinephric stranding. No stones are seen on the right. There is no hydronephrosis or hydroureter on the right. There is a 1.5 cm x 1.3 cm mixed density lesion in the right kidney with internal hyperdensity (2-39). No lesion was seen in this location on the remote lumbar spine MRI from 2016. The bladder is decompressed but grossly unremarkable. Stomach/Bowel: The stomach is unremarkable. There is no evidence of bowel obstruction. There is a medially projecting duodenal diverticulum. There is no abnormal bowel wall thickening or inflammatory change. There is colonic diverticulosis without evidence of acute diverticulitis. The appendix is not identified, presumed surgically absent. Vascular/Lymphatic: There is calcified plaque throughout the nonaneurysmal abdominal aorta. There is no  abdominal or pelvic lymphadenopathy. Reproductive: The prostate and seminal vesicles are unremarkable. Other: There is no ascites or free air. Musculoskeletal: There is no acute osseous abnormality or suspicious osseous lesion IMPRESSION: 1. Punctate stone in the left ureter just proximal to the UVJ, and additional 3 mm stone at the UVJ with mild upstream hydroureteronephrosis and perinephric stranding. 2. Additional nonobstructing left upper pole renal stones measuring up to 5 mm. 3. 1.5 cm mixed density lesion in the right kidney with internal hyperdensity may reflect a hemorrhagic or proteinaceous cyst. Recommend nonemergent outpatient abdominal MRI with and without contrast for further evaluation. 4. Diverticulosis without evidence of acute diverticulitis. Electronically Signed   By: Lesia Hausen M.D.   On: 12/30/2022 10:44    Pertinent labs & imaging results that were available during my care of the patient were reviewed by me and considered in my medical decision making (see MDM for details).  Medications Ordered in ED Medications  lactated ringers bolus 1,000 mL (0 mLs Intravenous Stopped 12/30/22 1117)  ondansetron (ZOFRAN) injection 4 mg (4 mg Intravenous Given 12/30/22 0918)  morphine (PF) 4 MG/ML injection 4 mg (4 mg Intravenous Given 12/30/22 0918)  tamsulosin (FLOMAX) capsule 0.4 mg (0.4 mg Oral Given 12/30/22 1116)  ketorolac (TORADOL) 15 MG/ML injection 15 mg (15 mg Intramuscular Given 12/30/22 1139)                                                                                                                                     Procedures Procedures  (including critical care time)  Medical Decision Making / ED Course   This patient presents to the ED for concern of left flank pain, this involves an extensive number of treatment options, and is a complaint that carries with it a high risk of complications and morbidity.  The differential diagnosis includes nephrolithiasis, pyelonephritis, diverticulitis, AAA, musculoskeletal pain  MDM: Patient seen emergency room for evaluation of left flank pain.  Physical exam with left CVA and left lower quadrant tenderness to palpation but is otherwise unremarkable.  Laboratory evaluation with leukocytosis of 12.0 but is otherwise unremarkable with a normal creatinine of 0.98 and urinalysis not consistent with infection.  CT stone study showing a punctate stone in the left ureter just proximal to the UVJ and additional 3 mm stone at the UVJ with mild upstream hydroureteronephrosis with perinephric stranding.  I did speak with Dr. Annabell Howells of urology to confirm that we treat these concomitant stones and series the same as we would a single stone and he did confirm that standard treatment is appropriate.  Patient given pain control, fluids  and Flomax and symptoms improved.  At this time he does not meet inpatient criteria for admission and he will be discharged with outpatient follow-up.  Outpatient referral to urology sent and patient discharged with return precautions.   Additional history obtained:  -External records from outside source obtained and reviewed  including: Chart review including previous notes, labs, imaging, consultation notes   Lab Tests: -I ordered, reviewed, and interpreted labs.   The pertinent results include:   Labs Reviewed  CBC WITH DIFFERENTIAL/PLATELET - Abnormal; Notable for the following components:      Result Value   WBC 12.0 (*)    Neutro Abs 9.8 (*)    Monocytes Absolute 1.3 (*)    All other components within normal limits  COMPREHENSIVE METABOLIC PANEL - Abnormal; Notable for the following components:   Glucose, Bld 165 (*)    Alkaline Phosphatase 35 (*)    All other components within normal limits  URINALYSIS, ROUTINE W REFLEX MICROSCOPIC - Abnormal; Notable for the following components:   Hgb urine dipstick SMALL (*)    Ketones, ur 20 (*)    Protein, ur 30 (*)    All other components within normal limits  LIPASE, BLOOD        Imaging Studies ordered: I ordered imaging studies including CT stone study I independently visualized and interpreted imaging. I agree with the radiologist interpretation   Medicines ordered and prescription drug management: Meds ordered this encounter  Medications   lactated ringers bolus 1,000 mL   ondansetron (ZOFRAN) injection 4 mg   morphine (PF) 4 MG/ML injection 4 mg   tamsulosin (FLOMAX) capsule 0.4 mg   naproxen (NAPROSYN) 375 MG tablet    Sig: Take 1 tablet (375 mg total) by mouth 2 (two) times daily.    Dispense:  20 tablet    Refill:  0   ondansetron (ZOFRAN-ODT) 4 MG disintegrating tablet    Sig: Take 1 tablet (4 mg total) by mouth every 8 (eight) hours as needed for nausea or vomiting.    Dispense:  20 tablet    Refill:  0    tamsulosin (FLOMAX) 0.4 MG CAPS capsule    Sig: Take 1 capsule (0.4 mg total) by mouth daily for 7 days.    Dispense:  7 capsule    Refill:  0   ketorolac (TORADOL) 15 MG/ML injection 15 mg    -I have reviewed the patients home medicines and have made adjustments as needed  Critical interventions none  Consultations Obtained: I requested consultation with the urologist on-call Dr. Annabell Howells,  and discussed lab and imaging findings as well as pertinent plan - they recommend: Standard nephrolithiasis treatment   Cardiac Monitoring: The patient was maintained on a cardiac monitor.  I personally viewed and interpreted the cardiac monitored which showed an underlying rhythm of: NSR  Social Determinants of Health:  Factors impacting patients care include: none   Reevaluation: After the interventions noted above, I reevaluated the patient and found that they have :improved  Co morbidities that complicate the patient evaluation  Past Medical History:  Diagnosis Date   Anxiety    CAD (coronary artery disease)    a. s/p NSTEMI in 01/2022 with DES to mid-LCx   Diabetes (HCC)    High cholesterol    Hypertension       Dispostion: I considered admission for this patient, but at this time he does not meet inpatient criteria for admission and he is safe for discharge with outpatient follow-up     Final Clinical Impression(s) / ED Diagnoses Final diagnoses:  Nephrolithiasis     @PCDICTATION @    Glendora Score, MD 12/30/22 1155

## 2022-12-30 NOTE — ED Triage Notes (Signed)
Pt brought by EMS who reported they were called for pt having back pain and not being able to urinate. EMS didn't administer meds. Pt's Wife gave Pt half Xanax pill this morning for pain. Pt says the pain started in his lower right abdomen and has since moved to the lower left of his back.

## 2022-12-31 ENCOUNTER — Ambulatory Visit (HOSPITAL_COMMUNITY)
Admission: RE | Admit: 2022-12-31 | Discharge: 2022-12-31 | Disposition: A | Discharge status: Home / Self Care | Payer: Medicare Other | Source: Ambulatory Visit | Attending: Urology | Admitting: Urology

## 2022-12-31 ENCOUNTER — Ambulatory Visit: Payer: Medicare Other | Admitting: Urology

## 2022-12-31 ENCOUNTER — Encounter: Payer: Self-pay | Admitting: Urology

## 2022-12-31 VITALS — BP 102/63 | HR 76

## 2022-12-31 DIAGNOSIS — N200 Calculus of kidney: Secondary | ICD-10-CM

## 2022-12-31 DIAGNOSIS — N201 Calculus of ureter: Secondary | ICD-10-CM | POA: Diagnosis not present

## 2022-12-31 DIAGNOSIS — N2889 Other specified disorders of kidney and ureter: Secondary | ICD-10-CM | POA: Diagnosis not present

## 2022-12-31 LAB — URINALYSIS, ROUTINE W REFLEX MICROSCOPIC
Bilirubin, UA: NEGATIVE
Glucose, UA: NEGATIVE
Ketones, UA: NEGATIVE
Nitrite, UA: NEGATIVE
Specific Gravity, UA: 1.02 (ref 1.005–1.030)
Urobilinogen, Ur: 0.2 mg/dL (ref 0.2–1.0)
pH, UA: 5.5 (ref 5.0–7.5)

## 2022-12-31 LAB — MICROSCOPIC EXAMINATION
Bacteria, UA: NONE SEEN
RBC, Urine: >30 /hpf — AB (ref 0–2)

## 2022-12-31 MED ORDER — TRAMADOL HCL 50 MG PO TABS: 50.0000 mg | ORAL_TABLET | Freq: Four times a day (QID) | ORAL | 0 refills | Status: DC | PRN

## 2022-12-31 NOTE — Addendum Note (Signed)
Addended by: Malen Gauze on: 12/31/2022 09:43 AM   Modules accepted: Orders

## 2022-12-31 NOTE — Patient Instructions (Addendum)
Renal Ultrasound Instructions  Please arrive 15 minutes prior to appointment time Arrive with a full bladder- 32 oz of water 30 minutes  prior to Ultrasound. Do not Void prior to Ultrasound      Dietary Guidelines to Help Prevent Kidney Stones Kidney stones are deposits of minerals and salts that form inside your kidneys. Your risk of developing kidney stones may be greater depending on your diet, your lifestyle, the medicines you take, and whether you have certain medical conditions. Most people can lower their risks of developing kidney stones by following these dietary guidelines. Your dietitian may give you more specific instructions depending on your overall health and the type of kidney stones you tend to develop. What are tips for following this plan? Reading food labels  Choose foods with "no salt added" or "low-salt" labels. Limit your salt (sodium) intake to less than 1,500 mg a day. Choose foods with calcium for each meal and snack. Try to eat about 300 mg of calcium at each meal. Foods that contain 200-500 mg of calcium a serving include: 8 oz (237 mL) of milk, calcium-fortifiednon-dairy milk, and calcium-fortifiedfruit juice. Calcium-fortified means that calcium has been added to these drinks. 8 oz (237 mL) of kefir, yogurt, and soy yogurt. 4 oz (114 g) of tofu. 1 oz (28 g) of cheese. 1 cup (150 g) of dried figs. 1 cup (91 g) of cooked broccoli. One 3 oz (85 g) can of sardines or mackerel. Most people need 1,000-1,500 mg of calcium a day. Talk to your dietitian about how much calcium is recommended for you. Shopping Buy plenty of fresh fruits and vegetables. Most people do not need to avoid fruits and vegetables, even if these foods contain nutrients that may contribute to kidney stones. When shopping for convenience foods, choose: Whole pieces of fruit. Pre-made salads with dressing on the side. Low-fat fruit and yogurt smoothies. Avoid buying frozen meals or prepared deli  foods. These can be high in sodium. Look for foods with live cultures, such as yogurt and kefir. Choose high-fiber grains, such as whole-wheat breads, oat bran, and wheat cereals. Cooking Do not add salt to food when cooking. Place a salt shaker on the table and allow each person to add their own salt to taste. Use vegetable protein, such as beans, textured vegetable protein (TVP), or tofu, instead of meat in pasta, casseroles, and soups. Meal planning Eat less salt, if told by your dietitian. To do this: Avoid eating processed or pre-made food. Avoid eating fast food. Eat less animal protein, including cheese, meat, poultry, or fish, if told by your dietitian. To do this: Limit the number of times you have meat, poultry, fish, or cheese each week. Eat a diet free of meat at least 2 days a week. Eat only one serving each day of meat, poultry, fish, or seafood. When you prepare animal proteins, cut pieces into small portion sizes. For most meat and fish, one serving is about the size of the palm of your hand. Eat at least five servings of fresh fruits and vegetables each day. To do this: Keep fruits and vegetables on hand for snacks. Eat one piece of fruit or a handful of berries with breakfast. Have a salad and fruit at lunch. Have two kinds of vegetables at dinner. You may be told to limit foods that are high in a substance called oxalate. These include: Spinach (cooked), rhubarb, beets, sweet potatoes, and Swiss chard. Peanuts. Potato chips, french fries, and baked potatoes with  skin on. Nuts and nut products. Chocolate. If you regularly take a diuretic medicine, make sure to eat at least 1 or 2 servings of fruits or vegetables that are high in potassium each day. These include: Avocado. Banana. Orange, prune, carrot, or tomato juice. Baked potato. Cabbage. Beans and split peas. Lifestyle  Drink enough fluid to keep your urine pale yellow. This is the most important thing you can  do. Spread your fluid intake throughout the day. If you drink alcohol: Limit how much you have to: 0-1 drink a day for women who are not pregnant. 0-2 drinks a day for men. Know how much alcohol is in your drink. In the [place] one drink equals one 12 oz bottle of beer (355 mL), one 5 oz glass of wine (148 mL), or one 1 oz glass of hard liquor (44 mL). Lose weight if told by your health care provider. Work with your dietitian to find an eating plan and weight loss strategies that work best for you. General information Talk to your health care provider and dietitian about taking daily supplements. Depending on your health and the cause of your kidney stones, you may be told: Do not take high-dose supplements of vitamin C (1,000 mg a day or more). To take a calcium supplement. To take a daily probiotic supplement. To take other supplements such as magnesium, fish oil, or vitamin B6. Take over-the-counter and prescription medicines only as told by your health care provider. These include supplements. What foods should I limit? Limit your intake of the following foods, or eat them as told by your dietitian. Vegetables Spinach. Rhubarb. Beets. Canned vegetables. Robert Mooney. Olives. Baked potatoes with skin. Grains Wheat bran. Baked goods. Salted crackers. Cereals high in sugar. Meats and other proteins Nuts. Nut butters. Large portions of meat, poultry, or fish. Salted, precooked, or cured meats, such as sausages, meat loaves, and hot dogs. Dairy Cheeses. Beverages Regular soft drinks. Regular vegetable juice. Seasonings and condiments Seasoning blends with salt. Salad dressings. Soy sauce. Ketchup. Barbecue sauce. Other foods Canned soups. Canned pasta sauce. Casseroles. Pizza. Lasagna. Frozen meals. Potato chips. [place] fries. The items listed above may not be a complete list of foods and beverages you should limit. Contact a dietitian for more information. What foods should I avoid? Talk to  your dietitian about specific foods you should avoid based on the type of kidney stones you have and your overall health. Fruits Grapefruit. The item listed above may not be a complete list of foods and beverages you should avoid. Contact a dietitian for more information. Summary Kidney stones are deposits of minerals and salts that form inside your kidneys. You can lower your risk of kidney stones by making changes to your diet. The most important thing you can do is drink enough fluid. Drink enough fluid to keep your urine pale yellow. Talk to your dietitian about how much calcium you should have each day, and eat less salt and animal protein as told by your dietitian. This information is not intended to replace advice given to you by your health care provider. Make sure you discuss any questions you have with your health care provider. Document Revised: 09/17/2021 Document Reviewed: 09/17/2021 Elsevier Patient Education  2024 [employer].

## 2022-12-31 NOTE — Progress Notes (Signed)
12/31/2022 9:27 AM   Robert Mooney 04-12-1951 [id number]  Referring provider: Glendora Score, MD [address] [place],  [place] [address]  Nephrolithiasis   HPI: Robert Mooney is a 72yo here for evaluation of nephrolithiasis. He went to the [facility] yesterday with new onset left flank pain and underwent CT which showed a 3mm left distal calculus and a left 5mm lower pole calculus. He had stone event was 25 years ago. He had a strong urgency last night and may have passed his calculus. KUb from today shows a 5mm left renal calculus but no ureteral calculus.    PMH: Past Medical History:  Diagnosis Date   Anxiety    CAD (coronary artery disease)    a. s/p NSTEMI in 01/2022 with DES to mid-LCx   Diabetes (HCC)    High cholesterol    Hypertension     Surgical History: Past Surgical History:  Procedure Laterality Date   CORONARY STENT INTERVENTION N/A 01/27/2022   Procedure: CORONARY STENT INTERVENTION;  Surgeon: Kathleene Hazel, MD;  [facility]: [facility];  Service: Cardiovascular;  Laterality: N/A;   LEFT HEART CATH AND CORONARY ANGIOGRAPHY N/A 01/27/2022   Procedure: LEFT HEART CATH AND CORONARY ANGIOGRAPHY;  Surgeon: Kathleene Hazel, MD;  Location: [facility];  Service: Cardiovascular;  Laterality: N/A;   ROTATOR CUFF REPAIR Right 2008    Home Medications:  Allergies as of 12/31/2022       Reactions   Atorvastatin Calcium    Other reaction(s): myalgias   Ezetimibe    Other reaction(s): myalgias   Pravastatin Sodium    Other reaction(s): myalgias   Rosuvastatin Calcium    Other reaction(s): myalgias        Medication List        Accurate as of December 31, 2022  9:27 AM. If you have any questions, ask your nurse or doctor.          Accu-Chek Aviva Plus test strip Generic drug: glucose blood TEST BLOOD SUGAR ONCE A DAY   acetaminophen 650 MG CR tablet Commonly known as: TYLENOL Take 650 mg by mouth daily.   Aspirin Low Dose 81 MG  tablet Generic drug: aspirin EC Take 1 tablet (81 mg total) by mouth daily. Swallow whole.   clopidogrel 75 MG tablet Commonly known as: PLAVIX Take 1 tablet (75 mg total) by mouth daily.   diphenhydrAMINE 25 mg capsule Commonly known as: BENADRYL Take 25 mg by mouth at bedtime as needed for allergies, sleep or itching.   gabapentin 600 MG tablet Commonly known as: NEURONTIN Take 300 mg by mouth 2 (two) times daily. 1 tablet 2 times daily   Garlic 1000 MG Caps Take 1,000 mg by mouth daily.   losartan 50 MG tablet Commonly known as: COZAAR Take 50 mg by mouth daily.   metFORMIN 500 MG tablet Commonly known as: GLUCOPHAGE Take 500 mg by mouth 2 (two) times daily.   metoprolol succinate 25 MG 24 hr tablet Commonly known as: Toprol XL Take 1 tablet (25 mg total) by mouth every evening.   naproxen 375 MG tablet Commonly known as: NAPROSYN Take 1 tablet (375 mg total) by mouth 2 (two) times daily.   naproxen sodium 220 MG tablet Commonly known as: ALEVE Take 220 mg by mouth 2 (two) times daily with a meal.   nitroGLYCERIN 0.4 MG SL tablet Commonly known as: NITROSTAT Place 1 tablet (0.4 mg total) under the tongue every 5 (five) minutes as  needed for chest pain.   ondansetron 4 MG disintegrating tablet Commonly known as: ZOFRAN-ODT Take 1 tablet (4 mg total) by mouth every 8 (eight) hours as needed for nausea or vomiting.   predniSONE 10 MG tablet Commonly known as: DELTASONE PLEASE SEE ATTACHED FOR DETAILED DIRECTIONS   Repatha SureClick 140 MG/ML Soaj Generic drug: Evolocumab Inject 140 mg into the skin every 14 (fourteen) days.   tamsulosin 0.4 MG Caps capsule Commonly known as: FLOMAX Take 1 capsule (0.4 mg total) by mouth daily for 7 days.   tiZANidine 4 MG tablet Commonly known as: ZANAFLEX Take 4 mg by mouth at bedtime as needed.   Vitamin D3 125 MCG (5000 UT) Caps Take 1 capsule by mouth daily.        Allergies:  Allergies  Allergen Reactions    Atorvastatin Calcium     Other reaction(s): myalgias   Ezetimibe     Other reaction(s): myalgias   Pravastatin Sodium     Other reaction(s): myalgias   Rosuvastatin Calcium     Other reaction(s): myalgias    Family History: Family History  Problem Relation Age of Onset   Neuropathy Neg Hx     Social History:  reports that he has never smoked. He has never used smokeless tobacco. He reports that he does not drink alcohol and does not use drugs.  ROS: All other review of systems were reviewed and are negative except what is noted above in HPI  Physical Exam: BP 102/63   Pulse 76   Constitutional:  Alert and oriented, No acute distress. HEENT: [place] AT, moist mucus membranes.  Trachea midline, no masses. Cardiovascular: No clubbing, cyanosis, or edema. Respiratory: Normal respiratory effort, no increased work of breathing. GI: Abdomen is soft, nontender, nondistended, no abdominal masses GU: No CVA tenderness.  Lymph: No cervical or inguinal lymphadenopathy. Skin: No rashes, bruises or suspicious lesions. Neurologic: Grossly intact, no focal deficits, moving all 4 extremities. Psychiatric: Normal mood and affect.  Laboratory Data: Lab Results  Component Value Date   WBC 12.0 (H) 12/30/2022   HGB 14.6 12/30/2022   HCT 42.9 12/30/2022   MCV 92.5 12/30/2022   PLT 185 12/30/2022    Lab Results  Component Value Date   CREATININE 0.98 12/30/2022    No results found for: "PSA"  No results found for: "TESTOSTERONE"  Lab Results  Component Value Date   HGBA1C 5.9 (H) 01/28/2022    Urinalysis    Component Value Date/Time   COLORURINE YELLOW 12/30/2022 0921   APPEARANCEUR CLEAR 12/30/2022 0921   LABSPEC 1.023 12/30/2022 0921   PHURINE 5.0 12/30/2022 0921   GLUCOSEU NEGATIVE 12/30/2022 0921   HGBUR SMALL (A) 12/30/2022 0921   BILIRUBINUR NEGATIVE 12/30/2022 0921   KETONESUR 20 (A) 12/30/2022 0921   PROTEINUR 30 (A) 12/30/2022 0921   NITRITE NEGATIVE 12/30/2022  0921   LEUKOCYTESUR NEGATIVE 12/30/2022 0921    Lab Results  Component Value Date   BACTERIA NONE SEEN 12/30/2022    Pertinent Imaging: Ct yesterday and KUB today: Images reviewed and discussed with the patient No results found for this or any previous visit.  No results found for this or any previous visit.  No results found for this or any previous visit.  No results found for this or any previous visit.  No results found for this or any previous visit.  No valid procedures specified. No results found for this or any previous visit.  Results for orders placed during the hospital encounter of  12/30/22  CT Renal Stone Study  Narrative CLINICAL DATA:  Left lower back pain.  EXAM: CT ABDOMEN AND PELVIS WITHOUT CONTRAST  TECHNIQUE: Multidetector CT imaging of the abdomen and pelvis was performed following the standard protocol without IV contrast.  RADIATION DOSE REDUCTION: This exam was performed according to the departmental dose-optimization program which includes automated exposure control, adjustment of the mA and/or kV according to patient size and/or use of iterative reconstruction technique.  COMPARISON:  None Available.  FINDINGS: Lower chest: The lung bases are clear. Coronary artery calcifications are noted. The imaged heart is otherwise unremarkable.  Hepatobiliary: The liver and gallbladder are unremarkable. There is no biliary ductal dilatation.  Pancreas: Unremarkable.  Spleen: Unremarkable.  Adrenals/Urinary Tract: The adrenals are unremarkable.  There are two nonobstructing left upper pole renal stones measuring approximately 5 mm. There is a punctate stone in the distal left ureter just proximal to the UVJ (2-79, 5-82), and an additional 3 mm stone at the UVJ. There is mild upstream hydroureteronephrosis with asymmetric perinephric stranding. No stones are seen on the right. There is no hydronephrosis or hydroureter on the right.  There  is a 1.5 cm x 1.3 cm mixed density lesion in the right kidney with internal hyperdensity (2-39). No lesion was seen in this location on the remote lumbar spine MRI from 2016.  The bladder is decompressed but grossly unremarkable.  Stomach/Bowel: The stomach is unremarkable. There is no evidence of bowel obstruction. There is a medially projecting duodenal diverticulum. There is no abnormal bowel wall thickening or inflammatory change. There is colonic diverticulosis without evidence of acute diverticulitis. The appendix is not identified, presumed surgically absent.  Vascular/Lymphatic: There is calcified plaque throughout the nonaneurysmal abdominal aorta. There is no abdominal or pelvic lymphadenopathy.  Reproductive: The prostate and seminal vesicles are unremarkable.  Other: There is no ascites or free air.  Musculoskeletal: There is no acute osseous abnormality or suspicious osseous lesion  IMPRESSION: 1. Punctate stone in the left ureter just proximal to the UVJ, and additional 3 mm stone at the UVJ with mild upstream hydroureteronephrosis and perinephric stranding. 2. Additional nonobstructing left upper pole renal stones measuring up to 5 mm. 3. 1.5 cm mixed density lesion in the right kidney with internal hyperdensity may reflect a hemorrhagic or proteinaceous cyst. Recommend nonemergent outpatient abdominal MRI with and without contrast for further evaluation. 4. Diverticulosis without evidence of acute diverticulitis.   Electronically Signed By: Lesia Hausen M.D. On: 12/30/2022 10:44   Assessment & Plan:    1. Nephrolithiasis -We discussed the management of kidney stones. These options include observation, ureteroscopy, shockwave lithotripsy (ESWL) and percutaneous nephrolithotomy (PCNL). We discussed which options are relevant to the patient's stone(s). We discussed the natural history of kidney stones as well as the complications of untreated stones and the  impact on quality of life without treatment as well as with each of the above listed treatments. We also discussed the efficacy of each treatment in its ability to clear the stone burden. With any of these management options I discussed the signs and symptoms of infection and the need for emergent treatment should these be experienced. For each option we discussed the ability of each procedure to clear the patient of their stone burden.   For observation I described the risks which include but are not limited to silent renal damage, life-threatening infection, need for emergent surgery, failure to pass stone and pain.   For ureteroscopy I described the risks which include bleeding,  infection, damage to contiguous structures, positioning injury, ureteral stricture, ureteral avulsion, ureteral injury, need for prolonged ureteral stent, inability to perform ureteroscopy, need for an interval procedure, inability to clear stone burden, stent discomfort/pain, heart attack, stroke, pulmonary embolus and the inherent risks with general anesthesia.   For shockwave lithotripsy I described the risks which include arrhythmia, kidney contusion, kidney hemorrhage, need for transfusion, pain, inability to adequately break up stone, inability to pass stone fragments, Steinstrasse, infection associated with obstructing stones, need for alternate surgical procedure, need for repeat shockwave lithotripsy, MI, CVA, PE and the inherent risks with anesthesia/conscious sedation.   For PCNL I described the risks including positioning injury, pneumothorax, hydrothorax, need for chest tube, inability to clear stone burden, renal laceration, arterial venous fistula or malformation, need for embolization of kidney, loss of kidney or renal function, need for repeat procedure, need for prolonged nephrostomy tube, ureteral avulsion, MI, CVA, PE and the inherent risks of general anesthesia.   - The patient would like to proceed with  observation.  Followup 4-5 weeks with renal US and then 6 months with KUB - Urinalysis, Routine w reflex microscopic   No follow-ups on file.  Wilkie Aye, MD  [facility] [place]

## 2023-01-06 DIAGNOSIS — M5416 Radiculopathy, lumbar region: Secondary | ICD-10-CM | POA: Diagnosis not present

## 2023-01-19 ENCOUNTER — Other Ambulatory Visit: Payer: Self-pay | Admitting: Adult Health

## 2023-01-25 ENCOUNTER — Ambulatory Visit: Payer: Medicare Other | Admitting: Urology

## 2023-01-27 NOTE — Progress Notes (Signed)
Cardiology Office Note:   Date:  01/28/2023  NAME:  Robert Mooney    MRN: [id number] DOB:  1951/06/21   PCP:  Sheliah Hatch, PA-C  Cardiologist:  Reatha Harps, MD  Electrophysiologist:  None   Referring MD: Sheliah Hatch, PA-C   Chief Complaint  Patient presents with   Follow-up         History of Present Illness:   Robert Mooney is a 72 y.o. male with a hx of DM, HTN, HLD, CAD who presents for follow-up.  He reports he is intolerant of Repatha.  Had muscle cramping.  Cannot take statins or Zetia.  We did discuss his only options left for bempedoic acid or possibly Leqvio.  He is interested in trying bempedoic acid.  Reports he can get short of breath.  He feels that this is due to metoprolol.  He wishes to stop this.  Blood pressure is stable.  I am okay to do this.  Reports he did have some indigestion and nitroglycerin did not help.  This was improved by belching.  No symptoms of angina.  He has completed 1 year of DAPT.  Can continue on aspirin.  Hemoglobin values are normal.  Reports no chest pain concerning for angina.  Can get short of breath with activity.  He actually is doing some paddle boating at the leg.  Can do up to 5 miles per week.  CV exam unremarkable.  Overall doing well.  Diabetes control with an A1c of 6.4.  Problem List NSTEMI/CAD -PCI mLCX 01/27/2022 -residual OM disease too small for PCI 2. HLD -T chol 114, HDL 42, LDL 39, TG 204 -intolerant of statin/zetia/repatha 3. DM -A1c 6.4 4. HTN  Past Medical History: Past Medical History:  Diagnosis Date   Anxiety    CAD (coronary artery disease)    a. s/p NSTEMI in 01/2022 with DES to mid-LCx   Diabetes (HCC)    High cholesterol    Hypertension     Past Surgical History: Past Surgical History:  Procedure Laterality Date   CORONARY STENT INTERVENTION N/A 01/27/2022   Procedure: CORONARY STENT INTERVENTION;  Surgeon: Kathleene Hazel, MD;  [facility]: [facility];  Service:  Cardiovascular;  Laterality: N/A;   LEFT HEART CATH AND CORONARY ANGIOGRAPHY N/A 01/27/2022   Procedure: LEFT HEART CATH AND CORONARY ANGIOGRAPHY;  Surgeon: Kathleene Hazel, MD;  Location: [facility];  Service: Cardiovascular;  Laterality: N/A;   ROTATOR CUFF REPAIR Right 2008    Current Medications: Current Meds  Medication Sig   ACCU-CHEK AVIVA PLUS test strip TEST BLOOD SUGAR ONCE A DAY   acetaminophen (TYLENOL) 650 MG CR tablet Take 650 mg by mouth daily.   aspirin EC 81 MG tablet Take 1 tablet (81 mg total) by mouth daily. Swallow whole.   Bempedoic Acid (NEXLETOL) 180 MG TABS Take 1 tablet (180 mg total) by mouth daily in the afternoon.   Cholecalciferol (VITAMIN D3) 125 MCG (5000 UT) CAPS Take 1 capsule by mouth daily.   diphenhydrAMINE (BENADRYL) 25 mg capsule Take 25 mg by mouth at bedtime as needed for allergies, sleep or itching.   gabapentin (NEURONTIN) 600 MG tablet Take 300 mg by mouth 2 (two) times daily. 1 tablet 2 times daily   Garlic 1000 MG CAPS Take 1,000 mg by mouth daily.   losartan (COZAAR) 50 MG tablet Take 50 mg by mouth daily.   metFORMIN (GLUCOPHAGE) 500 MG tablet Take 500 mg by  mouth 2 (two) times daily.   nitroGLYCERIN (NITROSTAT) 0.4 MG SL tablet Place 1 tablet (0.4 mg total) under the tongue every 5 (five) minutes as needed for chest pain.   [DISCONTINUED] clopidogrel (PLAVIX) 75 MG tablet TAKE 1 TABLET BY MOUTH DAILY   [DISCONTINUED] metoprolol succinate (TOPROL XL) 25 MG 24 hr tablet Take 1 tablet (25 mg total) by mouth every evening.     Allergies:    Atorvastatin calcium, Ezetimibe, Pravastatin sodium, and Rosuvastatin calcium   Social History: Social History   Socioeconomic History   Marital status: Married    Spouse name: Robert Mooney    Number of children: 0   Years of education: 12   Highest education level: Not on file  Occupational History   Occupation: Retired  Tobacco Use   Smoking status: Never   Smokeless tobacco: Never   Vaping Use   Vaping status: Never Used  Substance and Sexual Activity   Alcohol use: No    Alcohol/week: 0.0 standard drinks of alcohol   Drug use: No   Sexual activity: Not on file  Other Topics Concern   Not on file  Social History Narrative   Lives with wife, Robert Mooney.   Caffeine use: Drinks coffee (2 cups per day)   Social Determinants of Health   Financial Resource Strain: Not on file  Food Insecurity: Not on file  Transportation Needs: Not on file  Physical Activity: Not on file  Stress: Not on file  Social Connections: Not on file     Family History: The patient's family history is negative for Neuropathy.  ROS:   All other ROS reviewed and negative. Pertinent positives noted in the HPI.     EKGs/Labs/Other Studies Reviewed:   The following studies were personally reviewed by me today:  EKG:  EKG is not ordered today.        TTE 01/28/2022  1. Left ventricular ejection fraction, by estimation, is 60 to 65%. The  left ventricle has normal function. The left ventricle has no regional  wall motion abnormalities. There is mild left ventricular hypertrophy.  Left ventricular diastolic parameters  were normal.   2. Right ventricular systolic function is normal. The right ventricular  size is normal. There is normal pulmonary artery systolic pressure. The  estimated right ventricular systolic pressure is 21.1 mmHg.   3. The mitral valve is normal in structure. Mild mitral valve  regurgitation. No evidence of mitral stenosis.   4. The aortic valve is grossly normal. Aortic valve regurgitation is not  visualized. No aortic stenosis is present.   5. The inferior vena cava is normal in size with greater than 50%  respiratory variability, suggesting right atrial pressure of 3 mmHg.   Recent Labs: 12/30/2022: ALT 16; BUN 18; Creatinine, Ser 0.98; Hemoglobin 14.6; Platelets 185; Potassium 4.0; Sodium 136   Recent Lipid Panel    Component Value Date/Time   CHOL 185  01/28/2022 0334   TRIG 138 01/28/2022 0334   HDL 31 (L) 01/28/2022 0334   CHOLHDL 6.0 01/28/2022 0334   VLDL 28 01/28/2022 0334   LDLCALC 126 (H) 01/28/2022 0334    Physical Exam:   VS:  BP 134/68 (BP Location: Left Arm, Patient Position: Sitting, Cuff Size: Normal)   Pulse 67   Ht 5\' 8"  (1.727 m)   Wt 216 lb 12.8 oz (98.3 kg)   SpO2 96%   BMI 32.96 kg/m    Wt Readings from Last 3 Encounters:  01/28/23 216 lb 12.8 oz (  98.3 kg)  12/30/22 212 lb (96.2 kg)  08/20/22 238 lb (108 kg)    General: Well nourished, well developed, in no acute distress Head: Atraumatic, normal size  Eyes: PEERLA, EOMI  Neck: Supple, no JVD Endocrine: No thryomegaly Cardiac: Normal S1, S2; RRR; no murmurs, rubs, or gallops Lungs: Clear to auscultation bilaterally, no wheezing, rhonchi or rales  Abd: Soft, nontender, no hepatomegaly  Ext: No edema, pulses 2+ Musculoskeletal: No deformities, BUE and BLE strength normal and equal Skin: Warm and dry, no rashes   Neuro: Alert and oriented to person, place, time, and situation, CNII-XII grossly intact, no focal deficits  Psych: Normal mood and affect   ASSESSMENT:   Robert Mooney is a 72 y.o. male who presents for the following: 1. Coronary artery disease involving native coronary artery of native heart without angina pectoris   2. Mixed hyperlipidemia   3. Renovascular hypertension     PLAN:   1. Coronary artery disease involving native coronary artery of native heart without angina pectoris 2. Mixed hyperlipidemia 3. Renovascular hypertension -Non-STEMI last year.  PCI to left circumflex.  He does have residual disease in OM's that are too small for intervention.  He has completed 1 year of DAPT.  Continue aspirin.  Stop Plavix.  He does believe he is getting short of breath from metoprolol.  I am okay to stop this.  He will take 1/2 tablet of metoprolol for 3 days and stop.  He is intolerant to statins, Zetia as well as Repatha.  Described muscle  cramping with all these medications.  He is willing to try bempedoic acid.  We will prescribe today.  We do not have samples.  He will let me know how he does with this and if we can move forward with this medication.  The other options would be Leqvio.  He is diabetic but lipids are controlled.  No significant symptoms of angina today.  We will see how he does off metoprolol and hopefully bempedoic acid is affordable.  He will see me back in 6 months to discuss further.  If he does tolerate this I will plan to recheck his lipids before I see him back.  LV function was normal.  No signs of heart failure.  Overall doing well.  Disposition: Return in about 6 months (around 07/31/2023).  Medication Adjustments/Labs and Tests Ordered: Current medicines are reviewed at length with the patient today.  Concerns regarding medicines are outlined above.  No orders of the defined types were placed in this encounter.  Meds ordered this encounter  Medications   Bempedoic Acid (NEXLETOL) 180 MG TABS    Sig: Take 1 tablet (180 mg total) by mouth daily in the afternoon.    Dispense:  30 tablet    Refill:  1   Patient Instructions  Medication Instructions:  Your physician has recommended you make the following change in your medication:   Decrease metoprolol to 1/2 tablet daily for 3 days then stop taking Start Nexletol daily and let Dr Flora Lipps know if you can afford it.  *If you need a refill on your cardiac medications before your next appointment, please call your pharmacy*   Follow-Up: At [facility] you and your health needs are our priority.  As part of our continuing mission to provide you with exceptional heart care, we have created designated Provider Care Teams.  These Care Teams include your primary Cardiologist (physician) and Advanced Practice Providers (APPs -  Physician Assistants and  Nurse Practitioners) who all work together to provide you with the care you need, when you need  it.  We recommend signing up for the patient portal called "MyChart".  Sign up information is provided on this After Visit Summary.  MyChart is used to connect with patients for Virtual Visits (Telemedicine).  Patients are able to view lab/test results, encounter notes, upcoming appointments, etc.  Non-urgent messages can be sent to your provider as well.   To learn more about what you can do with MyChart, go to [email or web address].    Your next appointment:   6 month(s)  Provider:   Reatha Harps, MD        Time Spent with Patient: I have spent a total of 35 minutes with patient reviewing hospital notes, telemetry, EKGs, labs and examining the patient as well as establishing an assessment and plan that was discussed with the patient.  > 50% of time was spent in direct patient care.  Signed, Lenna Gilford. Flora Lipps, MD, [facility]  [facility]  [address], [address] [place], [place] [address] [phone]  01/28/2023 4:13 PM

## 2023-01-28 ENCOUNTER — Other Ambulatory Visit: Payer: Self-pay | Admitting: Cardiovascular Disease

## 2023-01-28 ENCOUNTER — Ambulatory Visit: Payer: Medicare Other | Attending: Cardiovascular Disease | Admitting: Cardiovascular Disease

## 2023-01-28 ENCOUNTER — Encounter: Payer: Self-pay | Admitting: Cardiovascular Disease

## 2023-01-28 VITALS — BP 134/68 | HR 67 | Ht 68.0 in | Wt 216.8 lb

## 2023-01-28 DIAGNOSIS — E782 Mixed hyperlipidemia: Secondary | ICD-10-CM

## 2023-01-28 DIAGNOSIS — I150 Renovascular hypertension: Secondary | ICD-10-CM

## 2023-01-28 DIAGNOSIS — I2510 Atherosclerotic heart disease of native coronary artery without angina pectoris: Secondary | ICD-10-CM | POA: Diagnosis not present

## 2023-01-28 MED ORDER — NEXLETOL 180 MG PO TABS: 180.0000 mg | ORAL_TABLET | Freq: Every day | ORAL | 1 refills | Status: DC

## 2023-01-28 NOTE — Patient Instructions (Signed)
Medication Instructions:  Your physician has recommended you make the following change in your medication:   Decrease metoprolol to 1/2 tablet daily for 3 days then stop taking Start Nexletol daily and let Dr Flora Lipps know if you can afford it.  *If you need a refill on your cardiac medications before your next appointment, please call your pharmacy*   Follow-Up: At [facility], you and your health needs are our priority.  As part of our continuing mission to provide you with exceptional heart care, we have created designated Provider Care Teams.  These Care Teams include your primary Cardiologist (physician) and Advanced Practice Providers (APPs -  Physician Assistants and Nurse Practitioners) who all work together to provide you with the care you need, when you need it.  We recommend signing up for the patient portal called "MyChart".  Sign up information is provided on this After Visit Summary.  MyChart is used to connect with patients for Virtual Visits (Telemedicine).  Patients are able to view lab/test results, encounter notes, upcoming appointments, etc.  Non-urgent messages can be sent to your provider as well.   To learn more about what you can do with MyChart, go to [email or web address].    Your next appointment:   6 month(s)  Provider:   Reatha Harps, MD

## 2023-01-31 ENCOUNTER — Ambulatory Visit (HOSPITAL_COMMUNITY)
Admission: RE | Admit: 2023-01-31 | Discharge: 2023-01-31 | Disposition: A | Discharge status: Home / Self Care | Payer: Medicare Other | Source: Ambulatory Visit | Attending: Urology | Admitting: Urology

## 2023-01-31 DIAGNOSIS — N200 Calculus of kidney: Secondary | ICD-10-CM | POA: Diagnosis not present

## 2023-02-04 ENCOUNTER — Ambulatory Visit: Payer: Medicare Other | Admitting: Urology

## 2023-02-10 ENCOUNTER — Other Ambulatory Visit: Payer: Self-pay | Admitting: Student

## 2023-02-10 ENCOUNTER — Other Ambulatory Visit: Payer: Self-pay | Admitting: Adult Health

## 2023-02-10 ENCOUNTER — Telehealth: Payer: Self-pay | Admitting: Cardiovascular Disease

## 2023-02-10 MED ORDER — METOPROLOL TARTRATE 25 MG PO TABS: 25.0000 mg | ORAL_TABLET | Freq: Two times a day (BID) | ORAL | 3 refills | Status: DC

## 2023-02-10 NOTE — Telephone Encounter (Signed)
Returned call to patients wife Talbert Forest in regards to his Metoprolol and increased HR. Pt has been off Metoprolol since 01/31/23. While patient was off the medication he said his HR dropped down in the 60's so he decided to go back on it and now it is running 70-80's. He states it depends on what he is doing. Informed pt that our HR and BP goes up with activity and that those numbers are in normal ranges. Pt states he got nervous because he does not want to do anything that is going to make him have another heart attack. If he stays on it then he needs it re-ordered. Please advise.

## 2023-02-10 NOTE — Telephone Encounter (Signed)
Returned call to pt with Dr. Marylene Buerger recommendation and that his script has been sent in to the pharmacy. Pt verbalized understanding.    Okay to refill metoprolol.  Gerri Spore T. Flora Lipps, MD, [facility] [place]  [facility]

## 2023-02-10 NOTE — Telephone Encounter (Signed)
Pt c/o medication issue:  1. Name of Medication:   metoprolol tartrate (LOPRESSOR) 25 MG tablet   2. How are you currently taking this medication (dosage and times per day)?   Not taking  3. Are you having a reaction (difficulty breathing--STAT)?   4. What is your medication issue?   Wife stated patient's HR has been trending high and wants patient put back on this medication.

## 2023-02-17 ENCOUNTER — Telehealth: Payer: Self-pay | Admitting: Cardiovascular Disease

## 2023-02-17 NOTE — Telephone Encounter (Signed)
Pt called and he states he was on Metoprolol Tartratre 25mg  twice a day and it was causing him extreme dizziness so he was taken off that and put on the "time release" one for once a day. He just got a new script for the above stated and he does not take that. Please advise.

## 2023-02-17 NOTE — Telephone Encounter (Signed)
Pt c/o medication issue:  1. Name of Medication:   metoprolol tartrate (LOPRESSOR) 25 MG tablet    2. How are you currently taking this medication (dosage and times per day)? Take 1 tablet (25 mg total) by mouth 2 (two) times daily.   3. Are you having a reaction (difficulty breathing--STAT)? No  4. What is your medication issue? Pt states he is supposed to be only taking the medication once a day but the prescription says twice a day and he would like to get this figured out. Please advise

## 2023-02-18 MED ORDER — METOPROLOL SUCCINATE ER 25 MG PO TB24: 25.0000 mg | ORAL_TABLET | Freq: Every day | ORAL | 3 refills | Status: DC

## 2023-02-18 NOTE — Telephone Encounter (Signed)
Called pt and advised of the medication change below as per Dr. Flora Lipps.  Pt verbalized understanding.    I think he wants to take metoprolol succinate 25 mg daily. Let's make that switch.  Gerri Spore T. Flora Lipps, MD, [facility] [place]  [facility]

## 2023-03-08 DIAGNOSIS — E785 Hyperlipidemia, unspecified: Secondary | ICD-10-CM | POA: Diagnosis not present

## 2023-03-08 DIAGNOSIS — E1140 Type 2 diabetes mellitus with diabetic neuropathy, unspecified: Secondary | ICD-10-CM | POA: Diagnosis not present

## 2023-03-08 DIAGNOSIS — I10 Essential (primary) hypertension: Secondary | ICD-10-CM | POA: Diagnosis not present

## 2023-03-08 DIAGNOSIS — I25118 Atherosclerotic heart disease of native coronary artery with other forms of angina pectoris: Secondary | ICD-10-CM | POA: Diagnosis not present

## 2023-03-08 DIAGNOSIS — E1169 Type 2 diabetes mellitus with other specified complication: Secondary | ICD-10-CM | POA: Diagnosis not present

## 2023-03-08 DIAGNOSIS — G629 Polyneuropathy, unspecified: Secondary | ICD-10-CM | POA: Diagnosis not present

## 2023-03-30 ENCOUNTER — Ambulatory Visit: Payer: Medicare Other | Admitting: Urology

## 2023-04-18 ENCOUNTER — Ambulatory Visit: Payer: Medicare Other | Admitting: Urology

## 2023-07-07 ENCOUNTER — Other Ambulatory Visit: Payer: Self-pay

## 2023-07-07 ENCOUNTER — Ambulatory Visit (HOSPITAL_COMMUNITY)
Admission: RE | Admit: 2023-07-07 | Discharge: 2023-07-07 | Disposition: A | Discharge status: Home / Self Care | Payer: Medicare Other | Source: Ambulatory Visit | Attending: Urology | Admitting: Urology

## 2023-07-07 ENCOUNTER — Other Ambulatory Visit (HOSPITAL_COMMUNITY): Payer: Self-pay | Admitting: Urology

## 2023-07-07 DIAGNOSIS — N200 Calculus of kidney: Secondary | ICD-10-CM | POA: Insufficient documentation

## 2023-07-07 DIAGNOSIS — N2889 Other specified disorders of kidney and ureter: Secondary | ICD-10-CM | POA: Diagnosis not present

## 2023-07-07 DIAGNOSIS — I878 Other specified disorders of veins: Secondary | ICD-10-CM | POA: Diagnosis not present

## 2023-07-08 ENCOUNTER — Encounter: Payer: Self-pay | Admitting: Urology

## 2023-07-08 ENCOUNTER — Ambulatory Visit: Payer: Medicare Other | Admitting: Urology

## 2023-07-08 VITALS — BP 168/68 | HR 76

## 2023-07-08 DIAGNOSIS — N200 Calculus of kidney: Secondary | ICD-10-CM | POA: Diagnosis not present

## 2023-07-08 LAB — URINALYSIS, ROUTINE W REFLEX MICROSCOPIC
Bilirubin, UA: NEGATIVE
Glucose, UA: NEGATIVE
Ketones, UA: NEGATIVE
Leukocytes,UA: NEGATIVE
Nitrite, UA: NEGATIVE
Protein,UA: NEGATIVE
RBC, UA: NEGATIVE
Specific Gravity, UA: 1.025 (ref 1.005–1.030)
Urobilinogen, Ur: 0.2 mg/dL (ref 0.2–1.0)
pH, UA: 6 (ref 5.0–7.5)

## 2023-07-08 NOTE — Progress Notes (Signed)
07/08/2023 10:02 AM   Robert Mooney Mar 18, 1951 [id number]  Referring provider: Sheliah Hatch, PA-C [address] [place],  [place] [address]  nephrolithiasis   HPI: Robert Mooney is a 72yo here for followup for nephrolithiasis. KUB today shows stable 7mm left mid pole calculus. No stone events since last visit. He drinks 32oz of lemonade daily and stopped his diet pepsi. His LUTS improved significantly after stopped the sodas. He has nocturia 2x but drinks coffee and water within 2 hours of going to bed   PMH: Past Medical History:  Diagnosis Date   Anxiety    CAD (coronary artery disease)    a. s/p NSTEMI in 01/2022 with DES to mid-LCx   Diabetes (HCC)    High cholesterol    Hypertension     Surgical History: Past Surgical History:  Procedure Laterality Date   CORONARY STENT INTERVENTION N/A 01/27/2022   Procedure: CORONARY STENT INTERVENTION;  Surgeon: Kathleene Hazel, MD;  Location: [facility] INVASIVE CV LAB;  Service: Cardiovascular;  Laterality: N/A;   LEFT HEART CATH AND CORONARY ANGIOGRAPHY N/A 01/27/2022   Procedure: LEFT HEART CATH AND CORONARY ANGIOGRAPHY;  Surgeon: Kathleene Hazel, MD;  Location: [facility] INVASIVE CV LAB;  Service: Cardiovascular;  Laterality: N/A;   ROTATOR CUFF REPAIR Right 2008    Home Medications:  Allergies as of 07/08/2023       Reactions   Atorvastatin Calcium    Other reaction(s): myalgias   Ezetimibe    Other reaction(s): myalgias   Pravastatin Sodium    Other reaction(s): myalgias   Rosuvastatin Calcium    Other reaction(s): myalgias        Medication List        Accurate as of July 08, 2023 10:02 AM. If you have any questions, ask your nurse or doctor.          STOP taking these medications    metoprolol succinate 25 MG 24 hr tablet Commonly known as: Toprol XL   naproxen 375 MG tablet Commonly known as: NAPROSYN   Nexletol 180 MG Tabs Generic drug: Bempedoic Acid   ondansetron 4 MG  disintegrating tablet Commonly known as: ZOFRAN-ODT   predniSONE 10 MG tablet Commonly known as: DELTASONE   Repatha SureClick 140 MG/ML Soaj Generic drug: Evolocumab   tiZANidine 4 MG tablet Commonly known as: ZANAFLEX   traMADol 50 MG tablet Commonly known as: Ultram       TAKE these medications    Accu-Chek Aviva Plus test strip Generic drug: glucose blood TEST BLOOD SUGAR ONCE A DAY   acetaminophen 650 MG CR tablet Commonly known as: TYLENOL Take 650 mg by mouth daily.   Aspirin Low Dose 81 MG tablet Generic drug: aspirin EC Take 1 tablet (81 mg total) by mouth daily. Swallow whole.   diphenhydrAMINE 25 mg capsule Commonly known as: BENADRYL Take 25 mg by mouth at bedtime as needed for allergies, sleep or itching.   gabapentin 600 MG tablet Commonly known as: NEURONTIN Take 300 mg by mouth 2 (two) times daily. 1 tablet 2 times daily   Garlic 1000 MG Caps Take 1,000 mg by mouth daily.   losartan 50 MG tablet Commonly known as: COZAAR Take 50 mg by mouth daily.   metFORMIN 500 MG tablet Commonly known as: GLUCOPHAGE Take 500 mg by mouth 2 (two) times daily.   naproxen sodium 220 MG tablet Commonly known as: ALEVE Take 220 mg by mouth 2 (two) times daily with a meal.  nitroGLYCERIN 0.4 MG SL tablet Commonly known as: NITROSTAT Place 1 tablet (0.4 mg total) under the tongue every 5 (five) minutes as needed for chest pain.   Vitamin D3 125 MCG (5000 UT) Caps Take 1 capsule by mouth daily.        Allergies:  Allergies  Allergen Reactions   Atorvastatin Calcium     Other reaction(s): myalgias   Ezetimibe     Other reaction(s): myalgias   Pravastatin Sodium     Other reaction(s): myalgias   Rosuvastatin Calcium     Other reaction(s): myalgias    Family History: Family History  Problem Relation Age of Onset   Neuropathy Neg Hx     Social History:  reports that he has never smoked. He has never used smokeless tobacco. He reports that he  does not drink alcohol and does not use drugs.  ROS: All other review of systems were reviewed and are negative except what is noted above in HPI  Physical Exam: BP (!) 168/68   Pulse 76   Constitutional:  Alert and oriented, No acute distress. HEENT: [place] AT, moist mucus membranes.  Trachea midline, no masses. Cardiovascular: No clubbing, cyanosis, or edema. Respiratory: Normal respiratory effort, no increased work of breathing. GI: Abdomen is soft, nontender, nondistended, no abdominal masses GU: No CVA tenderness.  Lymph: No cervical or inguinal lymphadenopathy. Skin: No rashes, bruises or suspicious lesions. Neurologic: Grossly intact, no focal deficits, moving all 4 extremities. Psychiatric: Normal mood and affect.  Laboratory Data: Lab Results  Component Value Date   WBC 12.0 (H) 12/30/2022   HGB 14.6 12/30/2022   HCT 42.9 12/30/2022   MCV 92.5 12/30/2022   PLT 185 12/30/2022    Lab Results  Component Value Date   CREATININE 0.98 12/30/2022    No results found for: "PSA"  No results found for: "TESTOSTERONE"  Lab Results  Component Value Date   HGBA1C 5.9 (H) 01/28/2022    Urinalysis    Component Value Date/Time   COLORURINE YELLOW 12/30/2022 0921   APPEARANCEUR Cloudy (A) 12/31/2022 0906   LABSPEC 1.023 12/30/2022 0921   PHURINE 5.0 12/30/2022 0921   GLUCOSEU Negative 12/31/2022 0906   HGBUR SMALL (A) 12/30/2022 0921   BILIRUBINUR Negative 12/31/2022 0906   KETONESUR 20 (A) 12/30/2022 0921   PROTEINUR 1+ (A) 12/31/2022 0906   PROTEINUR 30 (A) 12/30/2022 0921   NITRITE Negative 12/31/2022 0906   NITRITE NEGATIVE 12/30/2022 0921   LEUKOCYTESUR Trace (A) 12/31/2022 0906   LEUKOCYTESUR NEGATIVE 12/30/2022 0921    Lab Results  Component Value Date   LABMICR See below: 12/31/2022   WBCUA 6-10 (A) 12/31/2022   LABEPIT 0-10 12/31/2022   BACTERIA None seen 12/31/2022    Pertinent Imaging: KUB today: Images reviewed and discussed with the patient   Results for orders placed during the hospital encounter of 01/31/23  Abdomen 1 view (KUB)  Narrative CLINICAL DATA:  Nephrolithiasis  EXAM: ABDOMEN - 1 VIEW  COMPARISON:  12/31/2022.  FINDINGS: The bowel gas pattern is normal. Postop changes right lower quadrant. Left mid abdominal 6 mm calcification consistent with known stone is stable. Punctate calcification left pelvis could be the known ureteral stone versus a phlebolith and is a stable finding.  IMPRESSION: Left-sided nephrolithiasis is stable. Distal left ureteral stone versus phlebolith. Unremarkable bowel gas pattern.   Electronically Signed By: Layla Maw M.D. On: 02/06/2023 08:37  No results found for this or any previous visit.  No results found for this or any  previous visit.  No results found for this or any previous visit.  Results for orders placed during the hospital encounter of 01/31/23  Ultrasound renal complete  Narrative CLINICAL DATA:  Follow-up nephrolithiasis  EXAM: RENAL / URINARY TRACT ULTRASOUND COMPLETE  COMPARISON:  CT renal stone 12/30/2022  FINDINGS: Right Kidney:  Renal measurements: 12.7 x 7.4 x 5.9 cm = volume: 288.3 mL. Echogenicity within normal limits. No mass or hydronephrosis visualized.  Left Kidney:  Renal measurements: 12.5 x 7.0 x 5.8 cm = volume: 264.8 mL. Echogenicity within normal limits. No mass or hydronephrosis visualized. Note is made of a 4 mm renal stone midpole.  Bladder:  Appears normal for degree of bladder distention.  Other:  None.  IMPRESSION: 1. No hydronephrosis. 2. Nonobstructing left nephrolithiasis. 3. Previously described indeterminate mass within the right kidney on CT renal stone 12/30/2022 is not visualized on current exam and therefore not assessed. As previously discussed, recommend further evaluation with abdominal MRI.   Electronically Signed By: Annia Belt M.D. On: 01/31/2023 10:46  No results found for  this or any previous visit.  No results found for this or any previous visit.  Results for orders placed during the hospital encounter of 12/30/22  CT Renal Stone Study  Narrative CLINICAL DATA:  Left lower back pain.  EXAM: CT ABDOMEN AND PELVIS WITHOUT CONTRAST  TECHNIQUE: Multidetector CT imaging of the abdomen and pelvis was performed following the standard protocol without IV contrast.  RADIATION DOSE REDUCTION: This exam was performed according to the departmental dose-optimization program which includes automated exposure control, adjustment of the mA and/or kV according to patient size and/or use of iterative reconstruction technique.  COMPARISON:  None Available.  FINDINGS: Lower chest: The lung bases are clear. Coronary artery calcifications are noted. The imaged heart is otherwise unremarkable.  Hepatobiliary: The liver and gallbladder are unremarkable. There is no biliary ductal dilatation.  Pancreas: Unremarkable.  Spleen: Unremarkable.  Adrenals/Urinary Tract: The adrenals are unremarkable.  There are two nonobstructing left upper pole renal stones measuring approximately 5 mm. There is a punctate stone in the distal left ureter just proximal to the UVJ (2-79, 5-82), and an additional 3 mm stone at the UVJ. There is mild upstream hydroureteronephrosis with asymmetric perinephric stranding. No stones are seen on the right. There is no hydronephrosis or hydroureter on the right.  There is a 1.5 cm x 1.3 cm mixed density lesion in the right kidney with internal hyperdensity (2-39). No lesion was seen in this location on the remote lumbar spine MRI from 2016.  The bladder is decompressed but grossly unremarkable.  Stomach/Bowel: The stomach is unremarkable. There is no evidence of bowel obstruction. There is a medially projecting duodenal diverticulum. There is no abnormal bowel wall thickening or inflammatory change. There is colonic diverticulosis  without evidence of acute diverticulitis. The appendix is not identified, presumed surgically absent.  Vascular/Lymphatic: There is calcified plaque throughout the nonaneurysmal abdominal aorta. There is no abdominal or pelvic lymphadenopathy.  Reproductive: The prostate and seminal vesicles are unremarkable.  Other: There is no ascites or free air.  Musculoskeletal: There is no acute osseous abnormality or suspicious osseous lesion  IMPRESSION: 1. Punctate stone in the left ureter just proximal to the UVJ, and additional 3 mm stone at the UVJ with mild upstream hydroureteronephrosis and perinephric stranding. 2. Additional nonobstructing left upper pole renal stones measuring up to 5 mm. 3. 1.5 cm mixed density lesion in the right kidney with internal hyperdensity may reflect a  hemorrhagic or proteinaceous cyst. Recommend nonemergent outpatient abdominal MRI with and without contrast for further evaluation. 4. Diverticulosis without evidence of acute diverticulitis.   Electronically Signed By: Lesia Hausen M.D. On: 12/30/2022 10:44   Assessment & Plan:    1. Nephrolithiasis (Primary) -followup 1 year with KUB.  - Urinalysis, Routine w reflex microscopic   No follow-ups on file.  Wilkie Aye, MD  [facility]

## 2023-07-08 NOTE — Patient Instructions (Signed)

## 2023-07-27 NOTE — Progress Notes (Signed)
 Cardiology Office Note:  .   Date:  07/28/2023  ID:  Robert Mooney, DOB [birth year], MRN [id number] PCP: Robert Mooney  [facility] Providers Cardiologist:  Robert ONEIDA Decent, MD { History of Present Illness: .    Chief Complaint  Patient presents with   Follow-up    6 months.    Robert Mooney is a 73 y.o. male with history of CAD, HTN, HLD who presents for follow-up.    History of Present Illness   Robert Mooney is a 73 year old male with coronary artery disease who presents for follow-up.  He has a history of coronary artery disease and underwent a percutaneous coronary intervention (PCI) to the mid circumflex artery in August 2023 following a non-ST elevation myocardial infarction (non-STEMI). He has residual disease in a small obtuse marginal branch that is being managed medically. He completed one year of dual antiplatelet therapy (DAPT).  He experiences shortness of breath, particularly when lying down and bending over. He describes a long-standing pattern of needing to hold his breath for about 30 seconds when he first lies down, which then improves as he calms down. He also experiences shortness of breath when walking uphill with his dog, requiring him to stop and rest after a quarter mile. No chest pain, pressure, or tightness. He uses nitroglycerin  for episodes of shortness of breath, noting that it provides temporary relief. He reports that his shortness of breath may be related to overexertion, as he is very active and dislikes being sedentary.  He has hyperlipidemia and has been intolerant to multiple lipid-lowering medications, including statins, Zetia, Repatha , and bempedoic acid . He has resorted to dietary modifications, such as reducing egg consumption, to manage his cholesterol levels.  He has hypertension, which is well-controlled with losartan  50 mg daily. His blood pressure is reported as 128/66.  He has diabetes, which is well-controlled  with an A1c of 6.2. He mentions gaining 12 pounds over the winter. He experiences neuropathy in his feet, which affects his ability to walk comfortably, and is searching for suitable footwear.          Problem List NSTEMI/CAD -PCI mLCX 01/27/2022 -residual OM disease too small for PCI 2. HLD -T chol 187, HDL 39, LDL 119, TG 167 -intolerant of statin/zetia/repatha /nexlitol/zetia 3. DM -A1c 6.2 4. HTN    ROS: All other ROS reviewed and negative. Pertinent positives noted in the HPI.     Studies Reviewed: Robert Mooney   EKG Interpretation Date/Time:  Thursday July 28 2023 12:36:18 EST Ventricular Rate:  73 PR Interval:  170 QRS Duration:  78 QT Interval:  374 QTC Calculation: 412 R Axis:   6  Text Interpretation: Normal sinus rhythm Normal ECG Confirmed by Mooney Robert [phone]) on 07/28/2023 12:53:53 PM    LHC 01/27/2022 Mild non-obstructive disease in the proximal and mid LAD Severe mid Circumflex stenosis. There are two small caliber obtuse marginal branches. The first obtuse marginal branch has moderate stenosis. The second obtuse marginal branch has severe ostial stenosis. Both of these branches are too small for PCI.  Successful PTCA/DES x 1 mid Circumflex The RCA is a large dominant vessel with mild non-obstructive disease.  LV systolic function appears normal but poor opacification of the LV cavity with contrast.   TTE 01/28/2022  1. Left ventricular ejection fraction, by estimation, is 60 to 65%. The  left ventricle has normal function. The left ventricle has no regional  wall motion abnormalities. There is mild left  ventricular hypertrophy.  Left ventricular diastolic parameters  were normal.   2. Right ventricular systolic function is normal. The right ventricular  size is normal. There is normal pulmonary artery systolic pressure. The  estimated right ventricular systolic pressure is 21.1 mmHg.   3. The mitral valve is normal in structure. Mild mitral valve  regurgitation.  No evidence of mitral stenosis.   4. The aortic valve is grossly normal. Aortic valve regurgitation is not  visualized. No aortic stenosis is present.   5. The inferior vena cava is normal in size with greater than 50%  respiratory variability, suggesting right atrial pressure of 3 mmHg.   Physical Exam:   VS:  BP 128/66 (BP Location: Left Arm, Patient Position: Sitting, Cuff Size: Normal)   Pulse 73   Ht 5' 8 (1.727 m)   Wt 227 lb (103 kg)   BMI 34.52 kg/m    Wt Readings from Last 3 Encounters:  07/28/23 227 lb (103 kg)  01/28/23 216 lb 12.8 oz (98.3 kg)  12/30/22 212 lb (96.2 kg)    GEN: Well nourished, well developed in no acute distress NECK: No JVD; No carotid bruits CARDIAC: RRR, no murmurs, rubs, gallops RESPIRATORY:  Clear to auscultation without rales, wheezing or rhonchi  ABDOMEN: Soft, non-tender, non-distended EXTREMITIES:  No edema; No deformity  ASSESSMENT AND PLAN: .   Assessment and Plan    Coronary Artery Disease Post PCI to mid circumflex in August 2023 with residual OEM disease too small for stenting. No significant symptoms of angina today. EKG normal. -Continue Aspirin  81mg  daily.  Hyperlipidemia LDL 119. Intolerant to statins, Zetia, Repatha , and Bempedoic acid . Unwilling to try Inclisiran. -Continue with diet control.  Hypertension Well controlled. -Continue Losartan  50mg  daily.  Diabetes Well controlled with A1c of 6.2. -Continue current management.  Obesity BMI 34. Reports shortness of breath likely related to obesity. -Recommended weight loss and exercise.  Medication Refill -Refill Nitroglycerin  prescription.  Follow-up -Return in 1 year unless symptoms change.              Follow-up: Return in about 1 year (around 07/27/2024).   Signed, Robert DASEN. Barbaraann, MD, [facility]  [facility]  [address] [place], [place] [address] [phone]  1:28 PM

## 2023-07-28 ENCOUNTER — Ambulatory Visit: Payer: Medicare Other | Attending: Cardiovascular Disease | Admitting: Cardiovascular Disease

## 2023-07-28 ENCOUNTER — Encounter: Payer: Self-pay | Admitting: Cardiovascular Disease

## 2023-07-28 VITALS — BP 128/66 | HR 73 | Ht 68.0 in | Wt 227.0 lb

## 2023-07-28 DIAGNOSIS — I150 Renovascular hypertension: Secondary | ICD-10-CM | POA: Diagnosis not present

## 2023-07-28 DIAGNOSIS — I2510 Atherosclerotic heart disease of native coronary artery without angina pectoris: Secondary | ICD-10-CM | POA: Diagnosis not present

## 2023-07-28 DIAGNOSIS — R0602 Shortness of breath: Secondary | ICD-10-CM

## 2023-07-28 DIAGNOSIS — E669 Obesity, unspecified: Secondary | ICD-10-CM

## 2023-07-28 DIAGNOSIS — E782 Mixed hyperlipidemia: Secondary | ICD-10-CM

## 2023-07-28 DIAGNOSIS — T466X5D Adverse effect of antihyperlipidemic and antiarteriosclerotic drugs, subsequent encounter: Secondary | ICD-10-CM

## 2023-07-28 DIAGNOSIS — M791 Myalgia, unspecified site: Secondary | ICD-10-CM

## 2023-07-28 MED ORDER — NITROGLYCERIN 0.4 MG SL SUBL: 0.4000 mg | SUBLINGUAL_TABLET | SUBLINGUAL | 2 refills | Status: DC | PRN

## 2023-07-28 NOTE — Patient Instructions (Signed)

## 2023-08-23 DIAGNOSIS — M25562 Pain in left knee: Secondary | ICD-10-CM | POA: Diagnosis not present

## 2023-08-23 DIAGNOSIS — M25552 Pain in left hip: Secondary | ICD-10-CM | POA: Diagnosis not present

## 2023-08-29 DIAGNOSIS — M5416 Radiculopathy, lumbar region: Secondary | ICD-10-CM | POA: Diagnosis not present

## 2023-09-05 DIAGNOSIS — I10 Essential (primary) hypertension: Secondary | ICD-10-CM | POA: Diagnosis not present

## 2023-09-05 DIAGNOSIS — G629 Polyneuropathy, unspecified: Secondary | ICD-10-CM | POA: Diagnosis not present

## 2023-09-05 DIAGNOSIS — E1169 Type 2 diabetes mellitus with other specified complication: Secondary | ICD-10-CM | POA: Diagnosis not present

## 2023-09-05 DIAGNOSIS — Z0000 Encounter for general adult medical examination without abnormal findings: Secondary | ICD-10-CM | POA: Diagnosis not present

## 2023-09-05 DIAGNOSIS — I25118 Atherosclerotic heart disease of native coronary artery with other forms of angina pectoris: Secondary | ICD-10-CM | POA: Diagnosis not present

## 2023-09-05 DIAGNOSIS — E785 Hyperlipidemia, unspecified: Secondary | ICD-10-CM | POA: Diagnosis not present

## 2023-09-08 DIAGNOSIS — M5416 Radiculopathy, lumbar region: Secondary | ICD-10-CM | POA: Diagnosis not present

## 2023-09-29 DIAGNOSIS — L570 Actinic keratosis: Secondary | ICD-10-CM | POA: Diagnosis not present

## 2023-09-29 DIAGNOSIS — X32XXXD Exposure to sunlight, subsequent encounter: Secondary | ICD-10-CM | POA: Diagnosis not present

## 2023-09-29 DIAGNOSIS — L820 Inflamed seborrheic keratosis: Secondary | ICD-10-CM | POA: Diagnosis not present

## 2024-01-23 DIAGNOSIS — I10 Essential (primary) hypertension: Secondary | ICD-10-CM | POA: Diagnosis not present

## 2024-01-23 DIAGNOSIS — E785 Hyperlipidemia, unspecified: Secondary | ICD-10-CM | POA: Diagnosis not present

## 2024-01-23 DIAGNOSIS — E1169 Type 2 diabetes mellitus with other specified complication: Secondary | ICD-10-CM | POA: Diagnosis not present

## 2024-03-17 ENCOUNTER — Emergency Department (HOSPITAL_COMMUNITY)

## 2024-03-17 ENCOUNTER — Encounter (HOSPITAL_COMMUNITY): Payer: Self-pay | Admitting: Emergency Medicine

## 2024-03-17 ENCOUNTER — Other Ambulatory Visit: Payer: Self-pay

## 2024-03-17 ENCOUNTER — Inpatient Hospital Stay (HOSPITAL_COMMUNITY)
Admission: EM | Admit: 2024-03-17 | Discharge: 2024-03-20 | DRG: 322 | Disposition: A | Discharge status: Home / Self Care | Attending: Internal Medicine | Admitting: Internal Medicine

## 2024-03-17 DIAGNOSIS — Z955 Presence of coronary angioplasty implant and graft: Secondary | ICD-10-CM | POA: Diagnosis not present

## 2024-03-17 DIAGNOSIS — Z79899 Other long term (current) drug therapy: Secondary | ICD-10-CM

## 2024-03-17 DIAGNOSIS — Z888 Allergy status to other drugs, medicaments and biological substances status: Secondary | ICD-10-CM | POA: Diagnosis not present

## 2024-03-17 DIAGNOSIS — F419 Anxiety disorder, unspecified: Secondary | ICD-10-CM | POA: Diagnosis present

## 2024-03-17 DIAGNOSIS — I252 Old myocardial infarction: Secondary | ICD-10-CM

## 2024-03-17 DIAGNOSIS — Z7982 Long term (current) use of aspirin: Secondary | ICD-10-CM

## 2024-03-17 DIAGNOSIS — R079 Chest pain, unspecified: Secondary | ICD-10-CM | POA: Diagnosis not present

## 2024-03-17 DIAGNOSIS — E7800 Pure hypercholesterolemia, unspecified: Secondary | ICD-10-CM | POA: Diagnosis not present

## 2024-03-17 DIAGNOSIS — Z6834 Body mass index (BMI) 34.0-34.9, adult: Secondary | ICD-10-CM

## 2024-03-17 DIAGNOSIS — R1013 Epigastric pain: Secondary | ICD-10-CM | POA: Diagnosis not present

## 2024-03-17 DIAGNOSIS — I214 Non-ST elevation (NSTEMI) myocardial infarction: Principal | ICD-10-CM | POA: Diagnosis present

## 2024-03-17 DIAGNOSIS — I2510 Atherosclerotic heart disease of native coronary artery without angina pectoris: Secondary | ICD-10-CM | POA: Diagnosis present

## 2024-03-17 DIAGNOSIS — Z7984 Long term (current) use of oral hypoglycemic drugs: Secondary | ICD-10-CM

## 2024-03-17 DIAGNOSIS — E66811 Obesity, class 1: Secondary | ICD-10-CM | POA: Diagnosis present

## 2024-03-17 DIAGNOSIS — I10 Essential (primary) hypertension: Secondary | ICD-10-CM | POA: Diagnosis not present

## 2024-03-17 DIAGNOSIS — R072 Precordial pain: Secondary | ICD-10-CM | POA: Diagnosis not present

## 2024-03-17 DIAGNOSIS — I200 Unstable angina: Secondary | ICD-10-CM | POA: Diagnosis present

## 2024-03-17 DIAGNOSIS — E1169 Type 2 diabetes mellitus with other specified complication: Secondary | ICD-10-CM | POA: Diagnosis present

## 2024-03-17 DIAGNOSIS — E785 Hyperlipidemia, unspecified: Secondary | ICD-10-CM

## 2024-03-17 DIAGNOSIS — R6889 Other general symptoms and signs: Secondary | ICD-10-CM | POA: Diagnosis not present

## 2024-03-17 DIAGNOSIS — R0789 Other chest pain: Secondary | ICD-10-CM | POA: Diagnosis not present

## 2024-03-17 LAB — COMPREHENSIVE METABOLIC PANEL WITH GFR
ALT: 20 U/L (ref 0–44)
AST: 17 U/L (ref 15–41)
Albumin: 3.9 g/dL (ref 3.5–5.0)
Alkaline Phosphatase: 35 U/L — ABNORMAL LOW (ref 38–126)
Anion gap: 12 (ref 5–15)
BUN: 15 mg/dL (ref 8–23)
CO2: 22 mmol/L (ref 22–32)
Calcium: 9.2 mg/dL (ref 8.9–10.3)
Chloride: 104 mmol/L (ref 98–111)
Creatinine, Ser: 0.62 mg/dL (ref 0.61–1.24)
GFR, Estimated: >60 mL/min (ref >60)
Glucose, Bld: 187 mg/dL — ABNORMAL HIGH (ref 70–99)
Potassium: 3.6 mmol/L (ref 3.5–5.1)
Sodium: 138 mmol/L (ref 135–145)
Total Bilirubin: 0.8 mg/dL (ref 0.0–1.2)
Total Protein: 6.4 g/dL — ABNORMAL LOW (ref 6.5–8.1)

## 2024-03-17 LAB — CBC
HCT: 41.5 % (ref 39.0–52.0)
Hemoglobin: 14.4 g/dL (ref 13.0–17.0)
MCH: 32.2 pg (ref 26.0–34.0)
MCHC: 34.7 g/dL (ref 30.0–36.0)
MCV: 92.8 fL (ref 80.0–100.0)
Platelets: 225 K/uL (ref 150–400)
RBC: 4.47 MIL/uL (ref 4.22–5.81)
RDW: 12.2 % (ref 11.5–15.5)
WBC: 8.4 K/uL (ref 4.0–10.5)
nRBC: 0 % (ref 0.0–0.2)

## 2024-03-17 LAB — LIPASE, BLOOD: Lipase: 34 U/L (ref 11–51)

## 2024-03-17 LAB — TROPONIN I (HIGH SENSITIVITY): Troponin I (High Sensitivity): 70 ng/L — ABNORMAL HIGH (ref <18)

## 2024-03-17 NOTE — ED Triage Notes (Signed)
 Pt BIB [facility] from home c/o mid sternal chest pain and epigastric pain that started after eating tonight. Pt also c/o chronic lower back pain.  Pt was given 2 SL nitroglycerin , 324 mg ASA and xanax by wife. Pt was given 2 more nitroglycerin  by EMS.

## 2024-03-17 NOTE — ED Provider Notes (Signed)
 [place] EMERGENCY DEPARTMENT AT [facility] Provider Note  CSN: [id number] Arrival date & time: 03/17/24 2104  Chief Complaint(s) Chest Pain  HPI Robert Mooney is a 73 y.o. male history of coronary artery disease, hypertension, lipidemia, diabetes presenting to the emergency department with epigastric pain.  Patient reports that he was eating soup when he developed epigastric/lower chest pain.  Reports associated nausea, diaphoresis, weakness.  No vomiting.  When asked where his pain was he points at his epigastric region.  He reports that this started around 5 and lasted a brief period and then resolved.  Later he went in the shower and the pain recurred so he called paramedics.  Took nitroglycerin  without much improvement.  His wife gave him aspirin .  Patient reports that symptoms are currently all resolved.  He feels well.  Denies any radiation of his chest/epigastric pain.   Past Medical History Past Medical History:  Diagnosis Date   Anxiety    CAD (coronary artery disease)    a. s/p NSTEMI in 01/2022 with DES to mid-LCx   Diabetes (HCC)    High cholesterol    Hypertension    Patient Active Problem List   Diagnosis Date Noted   NSTEMI (non-ST elevated myocardial infarction) (HCC) 01/27/2022   HLD (hyperlipidemia) 01/27/2022   HTN (hypertension) 01/27/2022   Hereditary and idiopathic peripheral neuropathy 04/11/2015   Diabetes (HCC) 04/11/2015   Chronic low back pain with bilateral sciatica 04/11/2015   Spinal stenosis of lumbar region 04/11/2015   Home Medication(s) Prior to Admission medications   Medication Sig Start Date End Date Taking? Authorizing Provider  ACCU-CHEK AVIVA PLUS test strip TEST BLOOD SUGAR ONCE A DAY 03/31/15   [provider]  acetaminophen  (TYLENOL ) 650 MG CR tablet Take 650 mg by mouth daily.    [provider]  aspirin  EC 81 MG tablet Take 1 tablet (81 mg total) by mouth daily. Swallow whole. 01/29/22   Henry Manuelita NOVAK, NP  Cholecalciferol (VITAMIN D3) 125 MCG (5000 UT) CAPS Take 1 capsule by mouth daily.    [provider]  gabapentin  (NEURONTIN ) 600 MG tablet Take 300 mg by mouth 2 (two) times daily. 1 tablet 2 times daily 06/26/19   [provider]  Garlic 1000 MG CAPS Take 1,000 mg by mouth daily.    [provider]  losartan  (COZAAR ) 50 MG tablet Take 50 mg by mouth daily. 06/26/19   [provider]  metFORMIN (GLUCOPHAGE) 500 MG tablet Take 500 mg by mouth 2 (two) times daily. 01/21/15   [provider]  naproxen  sodium (ALEVE ) 220 MG tablet Take 220 mg by mouth 2 (two) times daily with a meal.    [provider]  nitroGLYCERIN  (NITROSTAT ) 0.4 MG SL tablet Place 1 tablet (0.4 mg total) under the tongue every 5 (five) minutes as needed for chest pain. 07/28/23   Barbaraann Darryle Ned, MD  Past Surgical History Past Surgical History:  Procedure Laterality Date   CORONARY STENT INTERVENTION N/A 01/27/2022   Procedure: CORONARY STENT INTERVENTION;  Surgeon: Verlin Lonni BIRCH, MD;  Location: [facility] INVASIVE CV LAB;  Service: Cardiovascular;  Laterality: N/A;   LEFT HEART CATH AND CORONARY ANGIOGRAPHY N/A 01/27/2022   Procedure: LEFT HEART CATH AND CORONARY ANGIOGRAPHY;  Surgeon: Verlin Lonni BIRCH, MD;  Location: [facility] INVASIVE CV LAB;  Service: Cardiovascular;  Laterality: N/A;   ROTATOR CUFF REPAIR Right 2008   Family History Family History  Problem Relation Age of Onset   Neuropathy Neg Hx     Social History Social History   Tobacco Use   Smoking status: Never   Smokeless tobacco: Never  Vaping Use   Vaping status: Never Used  Substance Use Topics   Alcohol use: No    Alcohol/week: 0.0 standard drinks of alcohol   Drug use: No   Allergies Atorvastatin  calcium , Ezetimibe, Pravastatin sodium, and Rosuvastatin  calcium   Review of Systems Review of Systems  All other systems reviewed and are negative.   Physical Exam Vital Signs  I have reviewed the triage vital signs BP 129/68   Pulse 64   Temp 98.2 F (36.8 C)   Resp 14   Ht 5' 8 (1.727 m)   Wt 103 kg   SpO2 96%   BMI 34.53 kg/m  Physical Exam Vitals and nursing note reviewed.  Constitutional:      General: He is not in acute distress.    Appearance: Normal appearance.  HENT:     Mouth/Throat:     Mouth: Mucous membranes are moist.  Eyes:     Conjunctiva/sclera: Conjunctivae normal.  Cardiovascular:     Rate and Rhythm: Normal rate and regular rhythm.     Pulses:          Radial pulses are 2+ on the right side and 2+ on the left side.  Pulmonary:     Effort: Pulmonary effort is normal. No respiratory distress.     Breath sounds: Normal breath sounds.  Abdominal:     General: Abdomen is flat.     Palpations: Abdomen is soft.     Tenderness: There is no abdominal tenderness.  Musculoskeletal:     Right lower leg: No edema.     Left lower leg: No edema.  Skin:    General: Skin is warm and dry.     Capillary Refill: Capillary refill takes less than 2 seconds.  Neurological:     Mental Status: He is alert and oriented to person, place, and time. Mental status is at baseline.  Psychiatric:        Mood and Affect: Mood normal.        Behavior: Behavior normal.     ED Results and Treatments Labs (all labs ordered are listed, but only abnormal results are displayed) Labs Reviewed  COMPREHENSIVE METABOLIC PANEL WITH GFR - Abnormal; Notable for the following components:      Result Value   Glucose, Bld 187 (*)    Total Protein 6.4 (*)    Alkaline Phosphatase 35 (*)    All other components within normal limits  TROPONIN I (HIGH SENSITIVITY) - Abnormal; Notable for the following components:   Troponin I (High Sensitivity) 70 (*)    All other components within normal limits  CBC  LIPASE, BLOOD  TROPONIN I (HIGH  SENSITIVITY)  Radiology DG Chest 2 View Result Date: 03/17/2024 CLINICAL DATA:  Midsternal chest pain and epigastric pain. EXAM: CHEST - 2 VIEW COMPARISON:  January 27, 2022 FINDINGS: The heart size and mediastinal contours are within normal limits. Both lungs are clear. The visualized skeletal structures are unremarkable. IMPRESSION: No active cardiopulmonary disease. Electronically Signed   By: Suzen Dials M.D.   On: 03/17/2024 21:37    Pertinent labs & imaging results that were available during my care of the patient were reviewed by me and considered in my medical decision making (see MDM for details).  Medications Ordered in ED Medications - No data to display                                                                                                                                   Procedures Procedures  (including critical care time)  Medical Decision Making / ED Course   MDM:  73 year old presenting to the emergency department with chest/epigastric pain.  Patient well-appearing, physical examination with no focal abnormality.  No abdominal tenderness.  EKG with some nonspecific ST changes, no STEMI.  History seems somewhat atypical for ACS, but troponin initially is elevated to 70.  Will obtain delta troponin.  If uptrending anticipate patient will need to be admitted.  Considered other process such as intra-abdominal process given complaint of possible abdominal pain, testing reassuring including normal lipase, normal LFTs and no tenderness to suggest process such as pancreatitis or cholecystitis.  Chest x-ray clear without evidence of pneumonia, pneumothorax.  Doubt other unusual process such as aortic dissection given resolution of symptoms, equal pulses, normal vitals. Signed out to oncoming provider Dr. Roselyn pending delta troponin.         Additional history obtained: -Additional history obtained from ems -External records from outside source obtained and reviewed including: Chart review including previous notes, labs, imaging, consultation notes including prior notes    Lab Tests: -I ordered, reviewed, and interpreted labs.   The pertinent results include:   Labs Reviewed  COMPREHENSIVE METABOLIC PANEL WITH GFR - Abnormal; Notable for the following components:      Result Value   Glucose, Bld 187 (*)    Total Protein 6.4 (*)    Alkaline Phosphatase 35 (*)    All other components within normal limits  TROPONIN I (HIGH SENSITIVITY) - Abnormal; Notable for the following components:   Troponin I (High Sensitivity) 70 (*)    All other components within normal limits  CBC  LIPASE, BLOOD  TROPONIN I (HIGH SENSITIVITY)    Notable for mild troponin elevation  EKG   EKG Interpretation Date/Time:  Saturday March 17 2024 21:15:24 EDT Ventricular Rate:  66 PR Interval:  170 QRS Duration:  107 QT Interval:  426 QTC Calculation: 447 R Axis:   29  Text Interpretation: Sinus rhythm Nonspecific T abnormalities, lateral leads Confirmed by Francesca Fallow ([id number]) on 03/17/2024 9:35:11 PM  Imaging Studies ordered: I ordered imaging studies including CXR On my interpretation imaging demonstrates no acute process I independently visualized and interpreted imaging. I agree with the radiologist interpretation   Medicines ordered and prescription drug management: No orders of the defined types were placed in this encounter.   -I have reviewed the patients home medicines and have made adjustments as needed   Reevaluation: After the interventions noted above, I reevaluated the patient and found that their symptoms have resolved  Co morbidities that complicate the patient evaluation  Past Medical History:  Diagnosis Date   Anxiety    CAD (coronary artery disease)    a. s/p NSTEMI in 01/2022 with DES  to mid-LCx   Diabetes (HCC)    High cholesterol    Hypertension       Dispostion: Disposition decision including need for hospitalization was considered, and patient disposition pending at time of sign out.    Final Clinical Impression(s) / ED Diagnoses Final diagnoses:  Chest pain, unspecified type     This chart was dictated using voice recognition software.  Despite best efforts to proofread,  errors can occur which can change the documentation meaning.    Francesca Elsie CROME, MD 03/17/24 [phone]

## 2024-03-17 NOTE — ED Provider Notes (Signed)
 Care assumed at shift change. Known CAD, here with chest/epigastric pain. Initial Trop is mildly elevated. Currently asymptomatic, awaiting delta trop.  Physical Exam  BP 129/68   Pulse 64   Temp 98.2 F (36.8 C)   Resp 14   Ht 5' 8 (1.727 m)   Wt 103 kg   SpO2 96%   BMI 34.53 kg/m   Physical Exam  Procedures  Procedures  ED Course / MDM    Medical Decision Making Amount and/or Complexity of Data Reviewed Labs: ordered. Radiology: ordered.   ***

## 2024-03-18 ENCOUNTER — Encounter (HOSPITAL_COMMUNITY): Payer: Self-pay | Admitting: Internal Medicine

## 2024-03-18 DIAGNOSIS — R079 Chest pain, unspecified: Secondary | ICD-10-CM

## 2024-03-18 DIAGNOSIS — E785 Hyperlipidemia, unspecified: Secondary | ICD-10-CM

## 2024-03-18 DIAGNOSIS — Z79899 Other long term (current) drug therapy: Secondary | ICD-10-CM | POA: Diagnosis not present

## 2024-03-18 DIAGNOSIS — Z888 Allergy status to other drugs, medicaments and biological substances status: Secondary | ICD-10-CM | POA: Diagnosis not present

## 2024-03-18 DIAGNOSIS — Z743 Need for continuous supervision: Secondary | ICD-10-CM | POA: Diagnosis not present

## 2024-03-18 DIAGNOSIS — E66811 Obesity, class 1: Secondary | ICD-10-CM | POA: Diagnosis present

## 2024-03-18 DIAGNOSIS — E1169 Type 2 diabetes mellitus with other specified complication: Secondary | ICD-10-CM | POA: Diagnosis not present

## 2024-03-18 DIAGNOSIS — I2583 Coronary atherosclerosis due to lipid rich plaque: Secondary | ICD-10-CM | POA: Diagnosis not present

## 2024-03-18 DIAGNOSIS — I200 Unstable angina: Secondary | ICD-10-CM | POA: Diagnosis present

## 2024-03-18 DIAGNOSIS — I10 Essential (primary) hypertension: Secondary | ICD-10-CM | POA: Diagnosis not present

## 2024-03-18 DIAGNOSIS — Z955 Presence of coronary angioplasty implant and graft: Secondary | ICD-10-CM | POA: Diagnosis not present

## 2024-03-18 DIAGNOSIS — E7800 Pure hypercholesterolemia, unspecified: Secondary | ICD-10-CM | POA: Diagnosis not present

## 2024-03-18 DIAGNOSIS — I25110 Atherosclerotic heart disease of native coronary artery with unstable angina pectoris: Secondary | ICD-10-CM | POA: Diagnosis not present

## 2024-03-18 DIAGNOSIS — I159 Secondary hypertension, unspecified: Secondary | ICD-10-CM

## 2024-03-18 DIAGNOSIS — I214 Non-ST elevation (NSTEMI) myocardial infarction: Secondary | ICD-10-CM

## 2024-03-18 DIAGNOSIS — I252 Old myocardial infarction: Secondary | ICD-10-CM | POA: Diagnosis not present

## 2024-03-18 DIAGNOSIS — E782 Mixed hyperlipidemia: Secondary | ICD-10-CM | POA: Diagnosis not present

## 2024-03-18 DIAGNOSIS — Z7984 Long term (current) use of oral hypoglycemic drugs: Secondary | ICD-10-CM | POA: Diagnosis not present

## 2024-03-18 DIAGNOSIS — Z7982 Long term (current) use of aspirin: Secondary | ICD-10-CM | POA: Diagnosis not present

## 2024-03-18 DIAGNOSIS — I2510 Atherosclerotic heart disease of native coronary artery without angina pectoris: Secondary | ICD-10-CM | POA: Diagnosis present

## 2024-03-18 DIAGNOSIS — G720 Drug-induced myopathy: Secondary | ICD-10-CM | POA: Diagnosis not present

## 2024-03-18 DIAGNOSIS — F419 Anxiety disorder, unspecified: Secondary | ICD-10-CM | POA: Diagnosis present

## 2024-03-18 DIAGNOSIS — Z6834 Body mass index (BMI) 34.0-34.9, adult: Secondary | ICD-10-CM | POA: Diagnosis not present

## 2024-03-18 LAB — CBC
HCT: 44.3 % (ref 39.0–52.0)
Hemoglobin: 14.8 g/dL (ref 13.0–17.0)
MCH: 32 pg (ref 26.0–34.0)
MCHC: 33.4 g/dL (ref 30.0–36.0)
MCV: 95.9 fL (ref 80.0–100.0)
Platelets: 235 K/uL (ref 150–400)
RBC: 4.62 MIL/uL (ref 4.22–5.81)
RDW: 12.5 % (ref 11.5–15.5)
WBC: 8.4 K/uL (ref 4.0–10.5)
nRBC: 0 % (ref 0.0–0.2)

## 2024-03-18 LAB — BASIC METABOLIC PANEL WITH GFR
Anion gap: 12 (ref 5–15)
BUN: 10 mg/dL (ref 8–23)
CO2: 27 mmol/L (ref 22–32)
Calcium: 9.5 mg/dL (ref 8.9–10.3)
Chloride: 102 mmol/L (ref 98–111)
Creatinine, Ser: 0.71 mg/dL (ref 0.61–1.24)
GFR, Estimated: >60 mL/min (ref >60)
Glucose, Bld: 135 mg/dL — ABNORMAL HIGH (ref 70–99)
Potassium: 4.2 mmol/L (ref 3.5–5.1)
Sodium: 141 mmol/L (ref 135–145)

## 2024-03-18 LAB — HEMOGLOBIN A1C
Hgb A1c MFr Bld: 5.8 % — ABNORMAL HIGH (ref 4.8–5.6)
Mean Plasma Glucose: 119.76 mg/dL

## 2024-03-18 LAB — TROPONIN I (HIGH SENSITIVITY)
Troponin I (High Sensitivity): 4162 ng/L (ref <18)
Troponin I (High Sensitivity): 5779 ng/L (ref <18)
Troponin I (High Sensitivity): 816 ng/L (ref <18)

## 2024-03-18 LAB — HEPARIN LEVEL (UNFRACTIONATED)
Heparin Unfractionated: 0.49 [IU]/mL (ref 0.30–0.70)
Heparin Unfractionated: 0.38 [IU]/mL (ref 0.30–0.70)

## 2024-03-18 LAB — GLUCOSE, CAPILLARY
Glucose-Capillary: 142 mg/dL — ABNORMAL HIGH (ref 70–99)
Glucose-Capillary: 113 mg/dL — ABNORMAL HIGH (ref 70–99)

## 2024-03-18 MED ORDER — LOSARTAN POTASSIUM 50 MG PO TABS
50.0000 mg | ORAL_TABLET | Freq: Every day | ORAL | Status: DC
  Administered 2024-03-18 – 2024-03-20 (×3): 50 mg via ORAL
  Filled 2024-03-18: qty 1
  Filled 2024-03-18: qty 2
  Filled 2024-03-18: qty 1

## 2024-03-18 MED ORDER — HEPARIN BOLUS VIA INFUSION
4000.0000 [IU] | Freq: Once | INTRAVENOUS | Status: AC
  Administered 2024-03-18: 4000 [IU] via INTRAVENOUS

## 2024-03-18 MED ORDER — ACETAMINOPHEN 325 MG PO TABS
650.0000 mg | ORAL_TABLET | Freq: Four times a day (QID) | ORAL | Status: DC | PRN
  Administered 2024-03-19: 650 mg via ORAL
  Filled 2024-03-18: qty 2

## 2024-03-18 MED ORDER — INSULIN ASPART 100 UNIT/ML IJ SOLN
0.0000 [IU] | Freq: Three times a day (TID) | INTRAMUSCULAR | Status: DC
  Administered 2024-03-18 – 2024-03-19 (×2): 2 [IU] via SUBCUTANEOUS

## 2024-03-18 MED ORDER — ONDANSETRON HCL 4 MG/2ML IJ SOLN: 4.0000 mg | Freq: Four times a day (QID) | INTRAMUSCULAR | Status: DC | PRN

## 2024-03-18 MED ORDER — HEPARIN (PORCINE) 25000 UT/250ML-% IV SOLN
1250.0000 [IU]/h | INTRAVENOUS | Status: DC
  Administered 2024-03-18 – 2024-03-19 (×3): 1250 [IU]/h via INTRAVENOUS
  Filled 2024-03-18 (×3): qty 250

## 2024-03-18 MED ORDER — INSULIN ASPART 100 UNIT/ML IJ SOLN: 0.0000 [IU] | Freq: Three times a day (TID) | INTRAMUSCULAR | Status: DC

## 2024-03-18 MED ORDER — ONDANSETRON HCL 4 MG PO TABS: 4.0000 mg | ORAL_TABLET | Freq: Four times a day (QID) | ORAL | Status: DC | PRN

## 2024-03-18 MED ORDER — ASPIRIN 81 MG PO CHEW
81.0000 mg | CHEWABLE_TABLET | ORAL | Status: AC
  Administered 2024-03-19: 81 mg via ORAL
  Filled 2024-03-18: qty 1

## 2024-03-18 MED ORDER — GABAPENTIN 300 MG PO CAPS
300.0000 mg | ORAL_CAPSULE | Freq: Two times a day (BID) | ORAL | Status: DC
  Administered 2024-03-18 – 2024-03-20 (×5): 300 mg via ORAL
  Filled 2024-03-18 (×5): qty 1

## 2024-03-18 MED ORDER — METOPROLOL TARTRATE 12.5 MG HALF TABLET
12.5000 mg | ORAL_TABLET | Freq: Two times a day (BID) | ORAL | Status: DC
  Administered 2024-03-18 (×2): 12.5 mg via ORAL
  Filled 2024-03-18 (×2): qty 1

## 2024-03-18 MED ORDER — ACETAMINOPHEN 650 MG RE SUPP: 650.0000 mg | Freq: Four times a day (QID) | RECTAL | Status: DC | PRN

## 2024-03-18 MED ORDER — FREE WATER
500.0000 mL | Freq: Once | Status: AC
  Administered 2024-03-19: 500 mL via ORAL

## 2024-03-18 MED ORDER — NITROGLYCERIN 2 % TD OINT
0.5000 [in_us] | TOPICAL_OINTMENT | Freq: Four times a day (QID) | TRANSDERMAL | Status: DC
  Administered 2024-03-18: 0.5 [in_us] via TOPICAL
  Filled 2024-03-18: qty 1

## 2024-03-18 MED ORDER — VITAMIN D 25 MCG (1000 UNIT) PO TABS
5000.0000 [IU] | ORAL_TABLET | Freq: Every day | ORAL | Status: DC
  Administered 2024-03-18 – 2024-03-20 (×3): 5000 [IU] via ORAL
  Filled 2024-03-18 (×3): qty 5

## 2024-03-18 MED ORDER — MORPHINE SULFATE (PF) 2 MG/ML IV SOLN
1.0000 mg | INTRAVENOUS | Status: DC | PRN
Start: 2024-03-18 — End: 2024-03-20

## 2024-03-18 MED ORDER — NITROGLYCERIN IN D5W 200-5 MCG/ML-% IV SOLN
3.0000 ug/min | INTRAVENOUS | Status: DC
  Administered 2024-03-18: 3 ug/min via INTRAVENOUS
  Filled 2024-03-18: qty 250

## 2024-03-18 MED ORDER — NITROGLYCERIN 0.4 MG SL SUBL
0.4000 mg | SUBLINGUAL_TABLET | SUBLINGUAL | Status: DC | PRN
  Administered 2024-03-18: 0.4 mg via SUBLINGUAL
  Filled 2024-03-18: qty 1

## 2024-03-18 MED ORDER — ASPIRIN 81 MG PO TBEC
81.0000 mg | DELAYED_RELEASE_TABLET | Freq: Every day | ORAL | Status: DC
  Administered 2024-03-18 – 2024-03-20 (×2): 81 mg via ORAL
  Filled 2024-03-18 (×2): qty 1

## 2024-03-18 MED ORDER — ALPRAZOLAM 0.25 MG PO TABS
0.2500 mg | ORAL_TABLET | Freq: Three times a day (TID) | ORAL | Status: DC | PRN
  Administered 2024-03-18: 0.25 mg via ORAL
  Filled 2024-03-18: qty 1

## 2024-03-18 NOTE — H&P (Addendum)
 History and Physical    Patient: Robert Mooney FMW:[id number] DOB: Jul 27, 1950 DOA: 03/17/2024 DOS: the patient was seen and examined on 03/18/2024 PCP: [name], PA-C  Patient coming from: Home  Chief Complaint:  Chief Complaint  Patient presents with   Chest Pain   HPI: Robert Mooney is a 73 y.o. male with medical history significant of coronary artery disease, hyperlipidemia, hypertension and T2Dm who presented with chest pain.  Patient reports not feeling well for the last 2 days, no specific symptoms, just not feeling himself. Today around 5:30 pm while having supper he experienced precordial chest pain, with no radiation or dyspnea. He took nitroglycerin  SL x2 with resolution of his symptoms. About 15 minutes later he experienced recurrent chest pain, but this time was more severe in intensity, associated with dyspnea and diaphoresis. No improving or worsening factors.  He repeated dose of nitroglycerin  x2 SL, took aspirin  and alprazolam, but unfortunately his symptoms persisted.  Because the severity of his chest pain, EMS was called. Her received two more nitroglycerin  SL tablets and he was brought to the ED.  His pain lasted about 45 minutes, finally subsiding while in the emergency room.   Patient had percutaneous coronary intervention to the mid circumflex artery in August 2023, following a non ST elevation myocardial infarction. He has residual disease managed medically. He completed one year of dual antiplatelet therapy and currently taking only aspirin .   He paddles a kayak regularly doing about 3 miles with no angina or dyspnea, in the past he used to do 6 but now has slow down to 3.  At home and while doing his routine activities denies any angina symptoms.      Review of Systems: As mentioned in the history of present illness. All other systems reviewed and are negative. Past Medical History:  Diagnosis Date   Anxiety    CAD (coronary artery disease)    a.  s/p NSTEMI in 01/2022 with DES to mid-LCx   Diabetes (HCC)    High cholesterol    Hypertension    Past Surgical History:  Procedure Laterality Date   CORONARY STENT INTERVENTION N/A 01/27/2022   Procedure: CORONARY STENT INTERVENTION;  Surgeon: Verlin Lonni BIRCH, MD;  Location: [facility] INVASIVE CV LAB;  Service: Cardiovascular;  Laterality: N/A;   LEFT HEART CATH AND CORONARY ANGIOGRAPHY N/A 01/27/2022   Procedure: LEFT HEART CATH AND CORONARY ANGIOGRAPHY;  Surgeon: Verlin Lonni BIRCH, MD;  Location: [facility] INVAS[facility] CV LAB;  Service: Cardiovascular;  Laterality: N/A;   ROTATOR CUFF REPAIR Right 2008   Social History:  reports that he has never smoked. He has never used smokeless tobacco. He reports that he does not drink alcohol and does not use drugs.  Allergies  Allergen Reactions   Atorvastatin  Calcium      Other reaction(s): myalgias   Ezetimibe     Other reaction(s): myalgias   Pravastatin Sodium     Other reaction(s): myalgias   Rosuvastatin Calcium      Other reaction(s): myalgias    Family History  Problem Relation Age of Onset   Neuropathy Neg Hx     Prior to Admission medications   Medication Sig Start Date End Date Taking? Authorizing Provider  ACCU-CHEK AVIVA PLUS test strip TEST BLOOD SUGAR ONCE A DAY 03/31/15   [provider]  acetaminophen  (TYLENOL ) 650 MG CR tablet Take 650 mg by mouth daily.    [provider]  aspirin  EC 81 MG tablet Take 1 tablet (81 mg  total) by mouth daily. Swallow whole. 01/29/22   Henry Manuelita NOVAK, NP  Cholecalciferol (VITAMIN D3) 125 MCG (5000 UT) CAPS Take 1 capsule by mouth daily.    [provider]  gabapentin  (NEURONTIN ) 600 MG tablet Take 300 mg by mouth 2 (two) times daily. 1 tablet 2 times daily 06/26/19   [provider]  Garlic 1000 MG CAPS Take 1,000 mg by mouth daily.    [provider]  losartan  (COZAAR ) 50 MG tablet Take 50 mg by mouth daily. 06/26/19   [provider]   metFORMIN (GLUCOPHAGE) 500 MG tablet Take 500 mg by mouth 2 (two) times daily. 01/21/15   [provider]  naproxen  sodium (ALEVE ) 220 MG tablet Take 220 mg by mouth 2 (two) times daily with a meal.    [provider]  nitroGLYCERIN  (NITROSTAT ) 0.4 MG SL tablet Place 1 tablet (0.4 mg total) under the tongue every 5 (five) minutes as needed for chest pain. 07/28/23   Barbaraann Darryle Ned, MD    Physical Exam: Vitals:   03/17/24 2230 03/17/24 2300 03/18/24 0000 03/18/24 0100  BP: 128/67 126/62 127/70 (!) 159/122  Pulse: 71 65 69 82  Resp: 12 10 (!) 9 (!) 25  Temp:      SpO2: 93% 95% 93% 96%  Weight:      Height:       BP (!) 159/122   Pulse 82   Temp 98.2 F (36.8 C)   Resp (!) 25   Ht 5' 8 (1.727 m)   Wt 103 kg   SpO2 96%   BMI 34.53 kg/m   Neurology awake and alert ENT with mild pallor with no icterus Cardiovascular with S1 and S2 present and regular with no gallops, rubs or murmurs No JVD Respiratory with no rales or wheezing, no rhonchi  Abdomen with no distention, soft and non tender No lower extremity edema    Data Reviewed:   Na 138, K 3,6 Cl 104 bicarbonate 22, glucose 187, bun 15 cr 0,62  High sensitive troponin 70 and 816  Wbc 8,4 hgb 14.4 plt 225   Chest radiograph with no cardiomegaly, no infiltrates or effusions.   EKG 66 bpm, normal axis, normal intervals, qtc 447, sinus rhythm with no significant ST segment or T wave changes.   Assessment and Plan: * Coronary artery disease Symptoms consistent with unstable angina/ NSTEMI.   Plan to continue IV heparin  for anticoagulation and antiplatelet therapy with aspirin .  Blood pressure control with losartan   Pain control with SL nitroglycerin  and IV morphine .  Apparently patient has not been tolerant to statins.  Check echocardiogram and follow up with cardiology recommendations Patient being transferred [facility] for further cardiac evaluation.   HTN (hypertension) Continue blood  pressure control with losartan .   Type 2 diabetes mellitus with hyperlipidemia (HCC) Hold on metformin  Admission glucose not elevated, will hold on insulin for now Check HgbA1c   Patient has not been tolerant to statins.   Obesity, class 1 Calculated BMI is 34.5    Advance Care Planning:   Code Status: Full Code   Consults: cardiology in [facility] per ED in [facility]   Family Communication: no family at the bedside   Severity of Illness: The appropriate patient status for this patient is INPATIENT. Inpatient status is judged to be reasonable and necessary in order to provide the required intensity of service to ensure the patient's safety. The patient's presenting symptoms, physical exam findings, and initial radiographic  and laboratory data in the context of their chronic comorbidities is felt to place them at high risk for further clinical deterioration. Furthermore, it is not anticipated that the patient will be medically stable for discharge from the hospital within 2 midnights of admission.   * I certify that at the point of admission it is my clinical judgment that the patient will require inpatient hospital care spanning beyond 2 midnights from the point of admission due to high intensity of service, high risk for further deterioration and high frequency of surveillance required.*  Author: Elidia Toribio Furnace, MD 03/18/2024 1:34 AM  For on call review [email or web address].

## 2024-03-18 NOTE — ED Notes (Signed)
 Pt states he is now having chest pain. Dr.Johnson made aware

## 2024-03-18 NOTE — Assessment & Plan Note (Addendum)
 Symptoms consistent with unstable angina/ NSTEMI.   Plan to continue IV heparin  for anticoagulation and antiplatelet therapy with aspirin .  Blood pressure control with losartan   Pain control with SL nitroglycerin  and IV morphine .  Apparently patient has not been tolerant to statins.  Check echocardiogram and follow up with cardiology recommendations Patient being transferred from [facility] to [facility] for further cardiac evaluation.

## 2024-03-18 NOTE — Assessment & Plan Note (Signed)
 Continue blood pressure control with losartan 

## 2024-03-18 NOTE — Progress Notes (Signed)
 ASSUMPTION OF CARE NOTE   03/18/2024 9:00 AM  Robert Mooney was seen and examined in the [facility] ED waiting for transport to [facility].  The H&P by the admitting provider, orders, imaging was reviewed.  Please see new orders.  Will continue to follow.  Pt wondering why he is waiting for bed for so long.  He says his CP is relieved now.  He reports being hungry and waiting for diet tray.  He denies SOB.  He remains on IV heparin  infusion. Repeat troponin labs have not been collected. Cardiology consulted already and will see when he arrives at [facility].   Vitals:   03/18/24 0630 03/18/24 0816  BP: (!) 95/58 135/69  Pulse: 67 72  Resp: 18 18  Temp: 98.2 F (36.8 C) 97.9 F (36.6 C)  SpO2: 94% 95%   Physical Exam Constitutional:      General: He is not in acute distress.    Appearance: He is well-developed and normal weight. He is not ill-appearing, toxic-appearing or diaphoretic.  HENT:     Head: Normocephalic and atraumatic.  Eyes:     Extraocular Movements: Extraocular movements intact.  Neck:     Vascular: No JVD.  Cardiovascular:     Rate and Rhythm: Normal rate and regular rhythm.  Pulmonary:     Effort: Pulmonary effort is normal. No respiratory distress.     Breath sounds: Normal breath sounds.  Chest:     Chest wall: No tenderness or crepitus.  Abdominal:     Palpations: Abdomen is soft.  Musculoskeletal:     Cervical back: Normal range of motion and neck supple.  Skin:    General: Skin is warm and dry.  Neurological:     Mental Status: He is alert.    Results for orders placed or performed during the hospital encounter of 03/17/24  CBC   Collection Time: 03/17/24  9:15 PM  Result Value Ref Range   WBC 8.4 4.0 - 10.5 K/uL   RBC 4.47 4.22 - 5.81 MIL/uL   Hemoglobin 14.4 13.0 - 17.0 g/dL   HCT 58.4 60.9 - 47.9 %   MCV 92.8 80.0 - 100.0 fL   MCH 32.2 26.0 - 34.0 pg   MCHC 34.7 30.0 - 36.0 g/dL   RDW 87.7 88.4 - 84.4 %   Platelets 225 150 - 400 K/uL   nRBC 0.0 0.0 - 0.2 %   Troponin I (High Sensitivity)   Collection Time: 03/17/24  9:15 PM  Result Value Ref Range   Troponin I (High Sensitivity) 70 (H) <18 ng/L  Comprehensive metabolic panel   Collection Time: 03/17/24 10:00 PM  Result Value Ref Range   Sodium 138 135 - 145 mmol/L   Potassium 3.6 3.5 - 5.1 mmol/L   Chloride 104 98 - 111 mmol/L   CO2 22 22 - 32 mmol/L   Glucose, Bld 187 (H) 70 - 99 mg/dL   BUN 15 8 - 23 mg/dL   Creatinine, Ser 9.37 0.61 - 1.24 mg/dL   Calcium  9.2 8.9 - 10.3 mg/dL   Total Protein 6.4 (L) 6.5 - 8.1 g/dL   Albumin 3.9 3.5 - 5.0 g/dL   AST 17 15 - 41 U/L   ALT 20 0 - 44 U/L   Alkaline Phosphatase 35 (L) 38 - 126 U/L   Total Bilirubin 0.8 0.0 - 1.2 mg/dL   GFR, Estimated >39 >39 mL/min   Anion gap 12 5 - 15  Lipase, blood   Collection Time: 03/17/24  10:00 PM  Result Value Ref Range   Lipase 34 11 - 51 U/L  Troponin I (High Sensitivity)   Collection Time: 03/17/24 11:40 PM  Result Value Ref Range   Troponin I (High Sensitivity) 816 (HH) <18 ng/L   Prolonged service time: 38 mins  KYM Louder, MD [facility]   03/17/2024  9:04 PM How to contact the [facility] Attending or Consulting provider 7A - 7P or covering provider during after hours 7P -7A, for this patient?  Check the care team in [facility] and look for a) attending/consulting [facility] provider listed and b) the [facility] team listed Log into [email or web address] and use [place]'s universal password to access. If you do not have the password, please contact the hospital operator. Locate the [facility] provider you are looking for under [facility] and page to a number that you can be directly reached. If you still have difficulty reaching the provider, please page the [facility] (Director on Call) for the Hospitalists listed on amion for assistance.

## 2024-03-18 NOTE — Progress Notes (Addendum)
 03/18/2024 11:31 AM  Came back to reassess patient still holding in ED for a bed at [facility]. He started having CP again 2/10, called out to RN, NTG given orally and within 5 mins chest pain down to 1/10.  He was also having SOB and anxiety but that is improving.  STAT repeat EKG reviewed with some minimal ST depression seen.  Pt remains on IV heparin .  Added topical nitropaste.  Will update cardiology team at [facility] via carelink.  Spoke with Dr. Pietro. Starting IV nitroglycerin  infusion, metoprolol , given aspirin .     Critical Care Procedure Note Authorized and Performed by: KYM Louder MD  Total Critical Care time:  30 mins Due to a high probability of clinically significant, life threatening deterioration, the patient required my highest level of preparedness to intervene emergently and I personally spent this critical care time directly and personally managing the patient.  This critical care time included obtaining a history; examining the patient, pulse oximetry; ordering and review of studies; arranging urgent treatment with development of a management plan; evaluation of patient's response of treatment; frequent reassessment; and discussions with other providers.  This critical care time was performed to assess and manage the high probability of imminent and life threatening deterioration that could result in multi-organ failure.  It was exclusive of separately billable procedures and treating other patients and teaching time.   Jerris Fleer How to contact the [facility] Attending or Consulting provider 7A - 7P or covering provider during after hours 7P -7A, for this patient?  Check the care team in [facility] and look for a) attending/consulting [facility] provider listed and b) the [facility] team listed Log into [email or web address] and use [facility]'s universal password to access. If you do not have the password, please contact the hospital operator. Locate the [facility] provider you are looking for under [facility] and  page to a number that you can be directly reached. If you still have difficulty reaching the provider, please page the [facility] (Director on Call) for the Hospitalists listed on amion for assistance.

## 2024-03-18 NOTE — Progress Notes (Signed)
 PHARMACY - ANTICOAGULATION CONSULT NOTE  Pharmacy Consult for heparin  Indication: chest pain/ACS  Allergies  Allergen Reactions   Atorvastatin  Calcium      Other reaction(s): myalgias   Ezetimibe     Other reaction(s): myalgias   Pravastatin Sodium     Other reaction(s): myalgias   Rosuvastatin Calcium      Other reaction(s): myalgias    Patient Measurements: Height: 5' 8 (172.7 cm) Weight: 103 kg (227 lb 1.2 oz) IBW/kg (Calculated) : 68.4 HEPARIN  DW (KG): 90.7  Vital Signs: Temp: 97.9 F (36.6 C) (09/28 0816) Temp Source: Oral (09/28 0816) BP: 135/69 (09/28 0816) Pulse Rate: 72 (09/28 0816)  Labs: Recent Labs    03/17/24 2115 03/17/24 2200 03/17/24 2340 03/18/24 0856 03/18/24 0904  HGB 14.4  --   --   --   --   HCT 41.5  --   --   --   --   PLT 225  --   --   --   --   HEPARINUNFRC  --   --   --   --  0.49  CREATININE  --  0.62  --  0.71  --   TROPONINIHS 70*  --  816* 5,779*  --     Estimated Creatinine Clearance: 92.8 mL/min (by C-G formula based on SCr of 0.71 mg/dL).   Medical History: Past Medical History:  Diagnosis Date   Anxiety    CAD (coronary artery disease)    a. s/p NSTEMI in 01/2022 with DES to mid-LCx   Diabetes (HCC)    High cholesterol    Hypertension    Assessment: 9 yoM presented to ED with epigastric pain. Pharmacy consulted to dose heparin  for ACS. PMH includes CAD (PCI 2023-completed DAPT x1, ASA only), HLD, HTN, T2DM  HL 0.49- therapeutic CBC WNL Trop 816 > 5779   Goal of Therapy:  Heparin  level 0.3-0.7 units/ml Monitor platelets by anticoagulation protocol: Yes   Plan:  Continue heparin  infusion at 1250 units/hr Check anti-Xa level in 8 hours and daily Continue to monitor H&H and platelets  Elspeth Sour, PharmD Clinical Pharmacist 03/18/2024 10:31 AM

## 2024-03-18 NOTE — Assessment & Plan Note (Signed)
Calculated BMI is 34.5

## 2024-03-18 NOTE — Progress Notes (Signed)
 PHARMACY - ANTICOAGULATION CONSULT NOTE  Pharmacy Consult for heparin  Indication: chest pain/ACS  Allergies  Allergen Reactions   Atorvastatin  Calcium      Other reaction(s): myalgias   Ezetimibe     Other reaction(s): myalgias   Pravastatin Sodium     Other reaction(s): myalgias   Rosuvastatin Calcium      Other reaction(s): myalgias    Patient Measurements: Height: 5' 8 (172.7 cm) Weight: 103 kg (227 lb 1.2 oz) IBW/kg (Calculated) : 68.4 HEPARIN  DW (KG): 90.7  Vital Signs: Temp: 98.2 F (36.8 C) (09/27 2205) BP: 126/62 (09/27 2300) Pulse Rate: 65 (09/27 2300)  Labs: Recent Labs    03/17/24 2115 03/17/24 2200 03/17/24 2340  HGB 14.4  --   --   HCT 41.5  --   --   PLT 225  --   --   CREATININE  --  0.62  --   TROPONINIHS 70*  --  816*    Estimated Creatinine Clearance: 92.8 mL/min (by C-G formula based on SCr of 0.62 mg/dL).   Medical History: Past Medical History:  Diagnosis Date   Anxiety    CAD (coronary artery disease)    a. s/p NSTEMI in 01/2022 with DES to mid-LCx   Diabetes (HCC)    High cholesterol    Hypertension    Assessment: 50 yoM presented to ED with epigastric pain. Pharmacy consulted to dose heparin  for ACS. PMH includes CAD (PCI 2023-completed DAPT x1, ASA only), HLD, HTN, T2DM  -CBC stable -Trop 70 > 816 -No PTA anticoagulation   Goal of Therapy:  Heparin  level 0.3-0.7 units/ml Monitor platelets by anticoagulation protocol: Yes   Plan:  Give 4000 units bolus x 1 Start heparin  infusion at 1250 units/hr Check anti-Xa level in 8 hours and daily while on heparin  Continue to monitor H&H and platelets  Lynwood Poplar, PharmD, BCPS Clinical Pharmacist 03/18/2024 12:50 AM

## 2024-03-18 NOTE — Progress Notes (Signed)
   03/18/24 0835  TOC Brief Assessment  Insurance and Status Reviewed  Patient has primary care physician Yes (BREEDLOVE, BRENT C)  Home environment has been reviewed From home with spouse.  Prior level of function: Independent  Prior/Current Home Services No current home services  Social Drivers of Health Review SDOH reviewed no interventions necessary  Readmission risk has been reviewed Yes Dori)  Transition of care needs no transition of care needs at this time   ICM has reviewed, and no needs have been identified at this time. We will continue to monitor patient advancement through interdisciplinary progression rounds. If new patient transition needs arise, please place a ICM consult.

## 2024-03-18 NOTE — Plan of Care (Signed)
  Problem: Education: Goal: Knowledge of General Education information will improve Description: Including pain rating scale, medication(s)/side effects and non-pharmacologic comfort measures Outcome: Not Progressing   Problem: Health Behavior/Discharge Planning: Goal: Ability to manage health-related needs will improve Outcome: Not Progressing   Problem: Clinical Measurements: Goal: Ability to maintain clinical measurements within normal limits will improve Outcome: Not Progressing Goal: Will remain free from infection Outcome: Not Progressing Goal: Diagnostic test results will improve Outcome: Not Progressing Goal: Respiratory complications will improve Outcome: Not Progressing Goal: Cardiovascular complication will be avoided Outcome: Not Progressing   Problem: Activity: Goal: Risk for activity intolerance will decrease Outcome: Not Progressing   Problem: Nutrition: Goal: Adequate nutrition will be maintained Outcome: Not Progressing   Problem: Coping: Goal: Level of anxiety will decrease Outcome: Not Progressing   Problem: Elimination: Goal: Will not experience complications related to bowel motility Outcome: Not Progressing Goal: Will not experience complications related to urinary retention Outcome: Not Progressing   Problem: Pain Managment: Goal: General experience of comfort will improve and/or be controlled Outcome: Not Progressing   Problem: Safety: Goal: Ability to remain free from injury will improve Outcome: Not Progressing   Problem: Skin Integrity: Goal: Risk for impaired skin integrity will decrease Outcome: Not Progressing   Problem: Education: Goal: Ability to describe self-care measures that may prevent or decrease complications (Diabetes Survival Skills Education) will improve Outcome: Not Progressing Goal: Individualized Educational Video(s) Outcome: Not Progressing   Problem: Coping: Goal: Ability to adjust to condition or change in  health will improve Outcome: Not Progressing   Problem: Fluid Volume: Goal: Ability to maintain a balanced intake and output will improve Outcome: Not Progressing   Problem: Health Behavior/Discharge Planning: Goal: Ability to identify and utilize available resources and services will improve Outcome: Not Progressing Goal: Ability to manage health-related needs will improve Outcome: Not Progressing   Problem: Metabolic: Goal: Ability to maintain appropriate glucose levels will improve Outcome: Not Progressing   Problem: Nutritional: Goal: Maintenance of adequate nutrition will improve Outcome: Not Progressing Goal: Progress toward achieving an optimal weight will improve Outcome: Not Progressing   Problem: Skin Integrity: Goal: Risk for impaired skin integrity will decrease Outcome: Not Progressing   Problem: Tissue Perfusion: Goal: Adequacy of tissue perfusion will improve Outcome: Not Progressing   Problem: Education: Goal: Understanding of CV disease, CV risk reduction, and recovery process will improve Outcome: Not Progressing Goal: Individualized Educational Video(s) Outcome: Not Progressing   Problem: Activity: Goal: Ability to return to baseline activity level will improve Outcome: Not Progressing   Problem: Cardiovascular: Goal: Ability to achieve and maintain adequate cardiovascular perfusion will improve Outcome: Not Progressing Goal: Vascular access site(s) Level 0-1 will be maintained Outcome: Not Progressing   Problem: Health Behavior/Discharge Planning: Goal: Ability to safely manage health-related needs after discharge will improve Outcome: Not Progressing

## 2024-03-18 NOTE — Consult Note (Addendum)
 As below, patient seen and examined.  Briefly he is a 73 year old male with past medical history of coronary artery disease, hypertension, diabetes mellitus, hyperlipidemia with non-ST elevation myocardial infarction.  Patient has had previous PCI of his circumflex.  He developed chest pain yesterday which was substernal without radiation.  Described as a burning pain and similar to his previous cardiac pain.  There was associated dyspnea and diaphoresis.  Some improvement with nitroglycerin .  However the pain returned and he presented to the emergency room at [facility].  His enzymes were positive and he was transferred for further management.  Presently pain-free.  Electrocardiogram shows sinus rhythm with nonspecific ST changes.  Peak troponin 5779.  Hemoglobin 14.8 and platelet count 245. 1 non-ST elevation myocardial infarction-patient has ruled in.  Plan to treat with aspirin , heparin , metoprolol , IV nitroglycerin .  He is statin intolerant.  Plan cardiac catheterization.  The risk and benefits including myocardial infarction, CVA and death discussed and he agrees to proceed.  Check echocardiogram for LV function. 2 hyperlipidemia-statin intolerant.  Would reinitiate PCSK9 inhibitor as an outpatient. 3 hypertension-patient's blood pressure is mildly elevated.  Will continue present medication and adjust based on follow-up readings. Redell Shallow, MD  Cardiology Consultation   Patient ID: NICKOLAOS BRALLIER CHERENE: [id number]; DOB: 03/18/1951  Admit date: 03/17/2024 Date of Consult: 03/18/2024  PCP:  Lanis Thresa [name]   [facility] Providers Cardiologist:  Darryle ONEIDA Decent, MD        Patient Profile: KAHIAU SCHEWE is a 73 y.o. male with a hx of CAD, hypertension, DM2, hyperlipidemia who is being seen 03/18/2024 for the evaluation of chest pain at the request of Dr. Vicci.  History of Present Illness: Mr. Jorgensen has a history of NSTEMI in 01/2022 with PCI/DES-mid LCx.  He had  residual disease and a small OM branch managed medically.  Echocardiogram at that time showed a preserved biventricular function with mild LVH, mild MR.  He was last seen in clinic with Dr. Carlota on 07/28/2023 and reported using nitroglycerin  for episodes of shortness of breath.  Shortness of breath felt related to overexertion as he is very active and dislikes being sedentary.  Unfortunately he has been intolerant to multiple lipid-lowering medications including statins, Zetia, Repatha , and bempedoic acid .  He has been managing hyperlipidemia with dietary modifications.    He presented to [facility] ER 03/17/24 after 2 bouts of chest pain. He developed sudden left sided chest pain at approximately 5pm last evening 5/10. He took a NTG which relieved the CP. He developed another bout of central to left sided chest pain rated as a 10/10  that radiated to his back. CP burned. He became diaphoretic, nauseous, and SOB. He took NTG again and tried to rest. CP persisted and EMS was dispatched. He was started on NTG gtt and heparin  gtt. He describes the chest pain as similar to prior NSTEMI.   He has had intermittent chest pain since admission, remains on NTG gtt. BP elevated.     Past Medical History:  Diagnosis Date   Anxiety    CAD (coronary artery disease)    a. s/p NSTEMI in 01/2022 with DES to mid-LCx   Diabetes (HCC)    High cholesterol    Hypertension     Past Surgical History:  Procedure Laterality Date   CORONARY STENT INTERVENTION N/A 01/27/2022   Procedure: CORONARY STENT INTERVENTION;  Surgeon: Verlin Lonni BIRCH, MD;  Location: [facility] INVASIVE CV LAB;  Service: Cardiovascular;  Laterality:  N/A;   LEFT HEART CATH AND CORONARY ANGIOGRAPHY N/A 01/27/2022   Procedure: LEFT HEART CATH AND CORONARY ANGIOGRAPHY;  Surgeon: Verlin Lonni BIRCH, MD;  Location: [facility] INVASIVE CV LAB;  Service: Cardiovascular;  Laterality: N/A;   ROTATOR CUFF REPAIR Right 2008     Home Medications:  Prior to Admission  medications   Medication Sig Start Date End Date Taking? Authorizing Provider  ACCU-CHEK AVIVA PLUS test strip TEST BLOOD SUGAR ONCE A DAY 03/31/15   [provider]  acetaminophen  (TYLENOL ) 650 MG CR tablet Take 650 mg by mouth daily.    [provider]  aspirin  EC 81 MG tablet Take 1 tablet (81 mg total) by mouth daily. Swallow whole. 01/29/22   Henry Manuelita NOVAK, NP  Cholecalciferol (VITAMIN D3) 125 MCG (5000 UT) CAPS Take 1 capsule by mouth daily.    [provider]  gabapentin  (NEURONTIN ) 600 MG tablet Take 300 mg by mouth 2 (two) times daily. 1 tablet 2 times daily 06/26/19   [provider]  Garlic 1000 MG CAPS Take 1,000 mg by mouth daily.    [provider]  losartan  (COZAAR ) 50 MG tablet Take 50 mg by mouth daily. 06/26/19   [provider]  metFORMIN (GLUCOPHAGE) 500 MG tablet Take 500 mg by mouth 2 (two) times daily. 01/21/15   [provider]  naproxen  sodium (ALEVE ) 220 MG tablet Take 220 mg by mouth 2 (two) times daily with a meal.    [provider]  nitroGLYCERIN  (NITROSTAT ) 0.4 MG SL tablet Place 1 tablet (0.4 mg total) under the tongue every 5 (five) minutes as needed for chest pain. 07/28/23   Barbaraann Darryle Ned, MD    Scheduled Meds:  aspirin  EC  81 mg Oral Daily   cholecalciferol  5,000 Units Oral Daily   gabapentin   300 mg Oral BID   losartan   50 mg Oral Daily   metoprolol  tartrate  12.5 mg Oral BID   Continuous Infusions:  heparin  1,250 Units/hr (03/18/24 1127)   nitroGLYCERIN  3 mcg/min (03/18/24 1324)   PRN Meds: acetaminophen  **OR** [DISCONTINUED] acetaminophen , ALPRAZolam, morphine  injection, ondansetron  **OR** ondansetron  (ZOFRAN ) IV  Allergies:    Allergies  Allergen Reactions   Atorvastatin  Calcium      Other reaction(s): myalgias   Ezetimibe     Other reaction(s): myalgias   Pravastatin Sodium     Other reaction(s): myalgias   Rosuvastatin Calcium      Other reaction(s): myalgias     Social History:   Social History   Socioeconomic History   Marital status: Married    Spouse name: Orlean    Number of children: 0   Years of education: 12   Highest education level: Not on file  Occupational History   Occupation: Retired  Tobacco Use   Smoking status: Never   Smokeless tobacco: Never  Vaping Use   Vaping status: Never Used  Substance and Sexual Activity   Alcohol use: No    Alcohol/week: 0.0 standard drinks of alcohol   Drug use: No   Sexual activity: Not on file  Other Topics Concern   Not on file  Social History Narrative   Lives with wife, Orlean.   Caffeine use: Drinks coffee (2 cups per day)   Social Drivers of Corporate investment banker Strain: Not on file  Food Insecurity: Not on file  Transportation Needs: Not on file  Physical Activity: Not on file  Stress: Not on file  Social Connections: Not on file  Intimate  Partner Violence: Not on file    Family History:    Family History  Problem Relation Age of Onset   Neuropathy Neg Hx      ROS:  Please see the history of present illness.   All other ROS reviewed and negative.     Physical Exam/Data: Vitals:   03/18/24 0816 03/18/24 1112 03/18/24 1305 03/18/24 1327  BP: 135/69 (!) 153/81  121/80  Pulse: 72 83  80  Resp: 18 18  16   Temp: 97.9 F (36.6 C)  98.5 F (36.9 C)   TempSrc: Oral  Oral   SpO2: 95% 97%  96%  Weight:      Height:        Intake/Output Summary (Last 24 hours) at 03/18/2024 1618 Last data filed at 03/18/2024 1127 Gross per 24 hour  Intake 169.41 ml  Output --  Net 169.41 ml      03/17/2024    9:12 PM 07/28/2023   12:32 PM 01/28/2023    9:30 AM  Last 3 Weights  Weight (lbs) 227 lb 1.2 oz 227 lb 216 lb 12.8 oz  Weight (kg) 103 kg 102.967 kg 98.34 kg     Body mass index is 34.53 kg/m.  General:  Well nourished, well developed, in no acute distress HEENT: normal Neck: no JVD Vascular: No carotid bruits; Distal pulses 2+ bilaterally Cardiac:   normal S1, S2; RRR; no murmur  Lungs:  clear to auscultation bilaterally, no wheezing, rhonchi or rales  Abd: soft, nontender, no hepatomegaly  Ext: no edema Musculoskeletal:  No deformities, BUE and BLE strength normal and equal Skin: warm and dry  Neuro:  CNs 2-12 intact, no focal abnormalities noted Psych:  Normal affect   EKG:  The EKG was personally reviewed and demonstrates:  SR with HR 78, ST depression anterolateral leads Telemetry:  Telemetry was personally reviewed and demonstrates:  SR with HR 70s  Relevant CV Studies:  Echo pending  LHC 2023:   Ost RCA lesion is 30% stenosed.   Prox RCA to Mid RCA lesion is 30% stenosed.   Prox Cx to Mid Cx lesion is 99% stenosed.   Prox LAD lesion is 30% stenosed.   Mid LAD lesion is 40% stenosed.   1st Mrg lesion is 60% stenosed.   2nd Mrg lesion is 99% stenosed.   A drug-eluting stent was successfully placed using a SYNERGY XD 2.75X38.   Post intervention, there is a 0% residual stenosis.   The left ventricular systolic function is normal.   LV end diastolic pressure is normal.   The left ventricular ejection fraction is 50-55% by visual estimate.   There is no mitral valve regurgitation.   Mild non-obstructive disease in the proximal and mid LAD Severe mid Circumflex stenosis. There are two small caliber obtuse marginal branches. The first obtuse marginal branch has moderate stenosis. The second obtuse marginal branch has severe ostial stenosis. Both of these branches are too small for PCI.  Successful PTCA/DES x 1 mid Circumflex The RCA is a large dominant vessel with mild non-obstructive disease.  LV systolic function appears normal but poor opacification of the LV cavity with contrast.    Recommendations: Will monitor overnight. Plan for DAPT with ASA and Brilinta  for one year. Will start high intensity statin. Continue beta blocker. Echo tomorrow.    Laboratory Data: High Sensitivity Troponin:   Recent Labs  Lab  03/17/24 2115 03/17/24 2340 03/18/24 0856 03/18/24 1014  TROPONINIHS 70* 816* 5,779* 4,162*  Chemistry Recent Labs  Lab 03/17/24 2200 03/18/24 0856  NA 138 141  K 3.6 4.2  CL 104 102  CO2 22 27  GLUCOSE 187* 135*  BUN 15 10  CREATININE 0.62 0.71  CALCIUM  9.2 9.5  GFRNONAA >60 >60  ANIONGAP 12 12    Recent Labs  Lab 03/17/24 2200  PROT 6.4*  ALBUMIN 3.9  AST 17  ALT 20  ALKPHOS 35*  BILITOT 0.8   Lipids No results for input(s): CHOL, TRIG, HDL, LABVLDL, LDLCALC, CHOLHDL in the last 168 hours.  Hematology Recent Labs  Lab 03/17/24 2115 03/18/24 0856  WBC 8.4 8.4  RBC 4.47 4.62  HGB 14.4 14.8  HCT 41.5 44.3  MCV 92.8 95.9  MCH 32.2 32.0  MCHC 34.7 33.4  RDW 12.2 12.5  PLT 225 235   Thyroid No results for input(s): TSH, FREET4 in the last 168 hours.  BNPNo results for input(s): BNP, PROBNP in the last 168 hours.  DDimer No results for input(s): DDIMER in the last 168 hours.  Radiology/Studies:  DG Chest 2 View Result Date: 03/17/2024 CLINICAL DATA:  Midsternal chest pain and epigastric pain. EXAM: CHEST - 2 VIEW COMPARISON:  January 27, 2022 FINDINGS: The heart size and mediastinal contours are within normal limits. Both lungs are clear. The visualized skeletal structures are unremarkable. IMPRESSION: No active cardiopulmonary disease. Electronically Signed   By: Suzen Dials M.D.   On: 03/17/2024 21:37     Assessment and Plan:  Chest pain Elevated troponin - hs troponin peaked at 5779, now down-trending - EKG with ST depression anterolateral leads - Continue heparin  drip, beta-blocker, aspirin  - continue nitroglycerin  gtt - He is currently chest pain-free -- will order echo   CAD - DES-LCX 2023 - has remained on ASA, losartan , and lopressor  12.5 mg BID   Hypertension - on losartan  and lopressor  at home - BP here elevated - on NTG gtt, can titrate this as needed for CP - received losartan  this morning along with   - will watch BP, can attempt to add amlodipine if BP control needed   Hyperlipidemia with LDL goal < 70 - intolerant to many cholesterol lowering medications including statins, Repatha , Zetia, and bempedoic acid  -Managing with dietary changes -Will repeat cholesterol panel here, last LDL in 2023 was 126 -- with further discussion, he was tolerating repatha  when injecting into stomach, but had side effects when he did one injection in his thigh and then stopped medication - encouraged him to restart PCSK9i   DM2 - Will collect an updated A1c -Holding metformin while in the hospital - Can institute SSI   Will plan for definitive angiography tomorrow. NPO at [facility].    Informed Consent   Shared Decision Making/Informed Consent The risks [stroke (1 in 1000), death (1 in 1000), kidney failure [usually temporary] (1 in 500), bleeding (1 in 200), allergic reaction [possibly serious] (1 in 200)], benefits (diagnostic support and management of coronary artery disease) and alternatives of a cardiac catheterization were discussed in detail with Mr. Moone and he is willing to proceed.       Risk Assessment/Risk Scores:    TIMI Risk Score for Unstable Angina or Non-ST Elevation MI:   The patient's TIMI risk score is 5, which indicates a 26% risk of all cause mortality, new or recurrent myocardial infarction or need for urgent revascularization in the next 14 days.         For questions or updates, please contact [facility] Please consult  [email or web address] for contact info under      Signed, Jon Nat Hails, PA  03/18/2024 4:18 PM

## 2024-03-18 NOTE — Progress Notes (Signed)
 PHARMACY - ANTICOAGULATION CONSULT NOTE  Pharmacy Consult for heparin  Indication: chest pain/ACS  Allergies  Allergen Reactions   Atorvastatin  Calcium      Other reaction(s): myalgias   Ezetimibe     Other reaction(s): myalgias   Pravastatin Sodium     Other reaction(s): myalgias   Rosuvastatin Calcium      Other reaction(s): myalgias    Patient Measurements: Height: 5' 8 (172.7 cm) Weight: 103 kg (227 lb 1.2 oz) IBW/kg (Calculated) : 68.4 HEPARIN  DW (KG): 90.7  Vital Signs: Temp: 98.1 F (36.7 C) (09/28 1820) Temp Source: Oral (09/28 1820) BP: 148/78 (09/28 1820) Pulse Rate: 69 (09/28 1820)  Labs: Recent Labs    03/17/24 2115 03/17/24 2200 03/17/24 2340 03/18/24 0856 03/18/24 0904 03/18/24 1014 03/18/24 1848  HGB 14.4  --   --  14.8  --   --   --   HCT 41.5  --   --  44.3  --   --   --   PLT 225  --   --  235  --   --   --   HEPARINUNFRC  --   --   --   --  0.49  --  0.38  CREATININE  --  0.62  --  0.71  --   --   --   TROPONINIHS 70*  --  816* 5,779*  --  4,162*  --     Estimated Creatinine Clearance: 92.8 mL/min (by C-G formula based on SCr of 0.71 mg/dL).   Medical History: Past Medical History:  Diagnosis Date   Anxiety    CAD (coronary artery disease)    a. s/p NSTEMI in 01/2022 with DES to mid-LCx   Diabetes (HCC)    High cholesterol    Hypertension    Assessment: 31 yoM presented to ED with epigastric pain. Pharmacy consulted to dose heparin  for ACS. PMH includes CAD (PCI 2023-completed DAPT x1, ASA only), HLD, HTN, T2DM  Heparin  level came back therapeutic at 0.38, on 1250 units/hr. No s/sx of bleeding or infusion issues. CBC stable earlier.   Goal of Therapy:  Heparin  level 0.3-0.7 units/ml Monitor platelets by anticoagulation protocol: Yes   Plan:  Continue heparin  infusion at 1250 units/hr Check anti-Xa level daily Continue to monitor H&H and platelets, for s/sx of bleeding   Thank you for allowing pharmacy to participate in this  patient's care,  Suzen Sour, PharmD, BCCCP Clinical Pharmacist  Phone: [phone] 03/18/2024 7:59 PM  Please check AMION for all [facility] phone numbers After 10:00 PM, call Main Pharmacy [phone]

## 2024-03-18 NOTE — Assessment & Plan Note (Signed)
 Hold on metformin  Admission glucose not elevated, will hold on insulin for now Check HgbA1c   Patient has not been tolerant to statins.

## 2024-03-19 ENCOUNTER — Encounter (HOSPITAL_COMMUNITY): Payer: Self-pay | Admitting: Internal Medicine

## 2024-03-19 ENCOUNTER — Encounter (HOSPITAL_COMMUNITY): Admission: EM | Disposition: A | Payer: Self-pay | Source: Home / Self Care | Attending: Family Medicine

## 2024-03-19 ENCOUNTER — Telehealth (HOSPITAL_COMMUNITY): Payer: Self-pay | Admitting: Pharmacist

## 2024-03-19 ENCOUNTER — Inpatient Hospital Stay (HOSPITAL_COMMUNITY)

## 2024-03-19 ENCOUNTER — Other Ambulatory Visit (HOSPITAL_COMMUNITY): Payer: Self-pay

## 2024-03-19 ENCOUNTER — Telehealth (HOSPITAL_COMMUNITY): Payer: Self-pay | Admitting: Pharmacy Technician

## 2024-03-19 DIAGNOSIS — I10 Essential (primary) hypertension: Secondary | ICD-10-CM | POA: Diagnosis not present

## 2024-03-19 DIAGNOSIS — I214 Non-ST elevation (NSTEMI) myocardial infarction: Secondary | ICD-10-CM | POA: Diagnosis not present

## 2024-03-19 DIAGNOSIS — I25110 Atherosclerotic heart disease of native coronary artery with unstable angina pectoris: Secondary | ICD-10-CM

## 2024-03-19 DIAGNOSIS — E1169 Type 2 diabetes mellitus with other specified complication: Secondary | ICD-10-CM | POA: Diagnosis not present

## 2024-03-19 DIAGNOSIS — I2510 Atherosclerotic heart disease of native coronary artery without angina pectoris: Secondary | ICD-10-CM | POA: Diagnosis not present

## 2024-03-19 DIAGNOSIS — R079 Chest pain, unspecified: Secondary | ICD-10-CM | POA: Diagnosis not present

## 2024-03-19 DIAGNOSIS — G720 Drug-induced myopathy: Secondary | ICD-10-CM

## 2024-03-19 HISTORY — PX: CORONARY STENT INTERVENTION: CATH118234

## 2024-03-19 HISTORY — PX: LEFT HEART CATH AND CORONARY ANGIOGRAPHY: CATH118249

## 2024-03-19 LAB — BASIC METABOLIC PANEL WITH GFR
Anion gap: 9 (ref 5–15)
BUN: 8 mg/dL (ref 8–23)
CO2: 22 mmol/L (ref 22–32)
Calcium: 9.2 mg/dL (ref 8.9–10.3)
Chloride: 107 mmol/L (ref 98–111)
Creatinine, Ser: 0.74 mg/dL (ref 0.61–1.24)
GFR, Estimated: >60 mL/min (ref >60)
Glucose, Bld: 123 mg/dL — ABNORMAL HIGH (ref 70–99)
Potassium: 4.2 mmol/L (ref 3.5–5.1)
Sodium: 138 mmol/L (ref 135–145)

## 2024-03-19 LAB — LIPID PANEL
Cholesterol: 190 mg/dL (ref 0–200)
HDL: 33 mg/dL — ABNORMAL LOW (ref >40)
LDL Cholesterol: 129 mg/dL — ABNORMAL HIGH (ref 0–99)
Total CHOL/HDL Ratio: 5.8 ratio
Triglycerides: 139 mg/dL (ref <150)
VLDL: 28 mg/dL (ref 0–40)

## 2024-03-19 LAB — ECHOCARDIOGRAM COMPLETE
Area-P 1/2: 3.42 cm2
Height: 68 in
S' Lateral: 3.1 cm
Single Plane A2C EF: 56.5 %
Weight: 3633.18 [oz_av]

## 2024-03-19 LAB — CBC
HCT: 44.5 % (ref 39.0–52.0)
Hemoglobin: 15 g/dL (ref 13.0–17.0)
MCH: 31.8 pg (ref 26.0–34.0)
MCHC: 33.7 g/dL (ref 30.0–36.0)
MCV: 94.3 fL (ref 80.0–100.0)
Platelets: 166 K/uL (ref 150–400)
RBC: 4.72 MIL/uL (ref 4.22–5.81)
RDW: 12.3 % (ref 11.5–15.5)
WBC: 8.1 K/uL (ref 4.0–10.5)
nRBC: 0 % (ref 0.0–0.2)

## 2024-03-19 LAB — GLUCOSE, CAPILLARY
Glucose-Capillary: 112 mg/dL — ABNORMAL HIGH (ref 70–99)
Glucose-Capillary: 133 mg/dL — ABNORMAL HIGH (ref 70–99)
Glucose-Capillary: 101 mg/dL — ABNORMAL HIGH (ref 70–99)
Glucose-Capillary: 108 mg/dL — ABNORMAL HIGH (ref 70–99)
Glucose-Capillary: 175 mg/dL — ABNORMAL HIGH (ref 70–99)

## 2024-03-19 LAB — HEPARIN LEVEL (UNFRACTIONATED): Heparin Unfractionated: 0.69 [IU]/mL (ref 0.30–0.70)

## 2024-03-19 LAB — POCT ACTIVATED CLOTTING TIME
Activated Clotting Time: 273 s
Activated Clotting Time: 285 s
Activated Clotting Time: 285 s

## 2024-03-19 LAB — TROPONIN I (HIGH SENSITIVITY): Troponin I (High Sensitivity): 2515 ng/L (ref <18)

## 2024-03-19 SURGERY — LEFT HEART CATH AND CORONARY ANGIOGRAPHY
Anesthesia: LOCAL

## 2024-03-19 MED ORDER — METOPROLOL TARTRATE 25 MG PO TABS
25.0000 mg | ORAL_TABLET | Freq: Two times a day (BID) | ORAL | Status: DC
  Administered 2024-03-19 – 2024-03-20 (×3): 25 mg via ORAL
  Filled 2024-03-19 (×3): qty 1

## 2024-03-19 MED ORDER — MIDAZOLAM HCL 2 MG/2ML IJ SOLN
INTRAMUSCULAR | Status: DC | PRN
  Administered 2024-03-19: 1 mg via INTRAVENOUS

## 2024-03-19 MED ORDER — SODIUM CHLORIDE 0.9 % IV SOLN: 250.0000 mL | INTRAVENOUS | Status: DC | PRN

## 2024-03-19 MED ORDER — MIDAZOLAM HCL 2 MG/2ML IJ SOLN
INTRAMUSCULAR | Status: AC
  Filled 2024-03-19: qty 2

## 2024-03-19 MED ORDER — FAMOTIDINE IN NACL 20-0.9 MG/50ML-% IV SOLN
INTRAVENOUS | Status: AC | PRN
  Administered 2024-03-19: 20 mg via INTRAVENOUS

## 2024-03-19 MED ORDER — ENOXAPARIN SODIUM 40 MG/0.4ML IJ SOSY
40.0000 mg | PREFILLED_SYRINGE | INTRAMUSCULAR | Status: DC
  Filled 2024-03-19: qty 0.4

## 2024-03-19 MED ORDER — VERAPAMIL HCL 2.5 MG/ML IV SOLN
INTRAVENOUS | Status: DC | PRN
  Administered 2024-03-19: 10 mL via INTRA_ARTERIAL

## 2024-03-19 MED ORDER — HEPARIN SODIUM (PORCINE) 1000 UNIT/ML IJ SOLN
INTRAMUSCULAR | Status: AC
  Filled 2024-03-19: qty 10

## 2024-03-19 MED ORDER — SODIUM CHLORIDE 0.9% FLUSH: 3.0000 mL | INTRAVENOUS | Status: DC | PRN

## 2024-03-19 MED ORDER — LIDOCAINE HCL (PF) 1 % IJ SOLN
INTRAMUSCULAR | Status: AC
  Filled 2024-03-19: qty 30

## 2024-03-19 MED ORDER — LIDOCAINE HCL (PF) 1 % IJ SOLN
INTRAMUSCULAR | Status: DC | PRN
  Administered 2024-03-19: 2 mL

## 2024-03-19 MED ORDER — FENTANYL CITRATE (PF) 100 MCG/2ML IJ SOLN
INTRAMUSCULAR | Status: DC | PRN
  Administered 2024-03-19 (×2): 25 ug via INTRAVENOUS

## 2024-03-19 MED ORDER — HEPARIN SODIUM (PORCINE) 1000 UNIT/ML IJ SOLN
INTRAMUSCULAR | Status: AC
Start: 2024-03-19 — End: 2024-03-19
  Filled 2024-03-19: qty 10

## 2024-03-19 MED ORDER — CLOPIDOGREL BISULFATE 75 MG PO TABS
75.0000 mg | ORAL_TABLET | Freq: Every day | ORAL | Status: DC
  Administered 2024-03-20: 75 mg via ORAL
  Filled 2024-03-19: qty 1

## 2024-03-19 MED ORDER — HEPARIN SODIUM (PORCINE) 1000 UNIT/ML IJ SOLN
INTRAMUSCULAR | Status: DC | PRN
  Administered 2024-03-19: 2000 [IU] via INTRAVENOUS
  Administered 2024-03-19: 5000 [IU] via INTRAVENOUS
  Administered 2024-03-19: 6000 [IU] via INTRAVENOUS
  Administered 2024-03-19 (×2): 2000 [IU] via INTRAVENOUS

## 2024-03-19 MED ORDER — NITROGLYCERIN 1 MG/10 ML FOR IR/CATH LAB
INTRA_ARTERIAL | Status: AC
  Filled 2024-03-19: qty 10

## 2024-03-19 MED ORDER — CLOPIDOGREL BISULFATE 300 MG PO TABS
ORAL_TABLET | ORAL | Status: DC | PRN
  Administered 2024-03-19: 600 mg via ORAL

## 2024-03-19 MED ORDER — SODIUM CHLORIDE 0.9% FLUSH
3.0000 mL | Freq: Two times a day (BID) | INTRAVENOUS | Status: DC
  Administered 2024-03-19: 3 mL via INTRAVENOUS

## 2024-03-19 MED ORDER — IOHEXOL 350 MG/ML SOLN
INTRAVENOUS | Status: DC | PRN
  Administered 2024-03-19: 175 mL via INTRA_ARTERIAL

## 2024-03-19 MED ORDER — FAMOTIDINE IN NACL 20-0.9 MG/50ML-% IV SOLN
INTRAVENOUS | Status: AC
  Filled 2024-03-19: qty 50

## 2024-03-19 MED ORDER — HYDRALAZINE HCL 20 MG/ML IJ SOLN: 10.0000 mg | INTRAMUSCULAR | Status: AC | PRN

## 2024-03-19 MED ORDER — CLOPIDOGREL BISULFATE 300 MG PO TABS
ORAL_TABLET | ORAL | Status: AC
Start: 2024-03-19 — End: 2024-03-19
  Filled 2024-03-19: qty 2

## 2024-03-19 MED ORDER — FREE WATER
500.0000 mL | Freq: Once | Status: AC
  Administered 2024-03-19: 500 mL via ORAL

## 2024-03-19 MED ORDER — HEPARIN (PORCINE) IN NACL 1000-0.9 UT/500ML-% IV SOLN
INTRAVENOUS | Status: DC | PRN
  Administered 2024-03-19: 1000 mL via SURGICAL_CAVITY

## 2024-03-19 MED ORDER — FENTANYL CITRATE (PF) 100 MCG/2ML IJ SOLN
INTRAMUSCULAR | Status: AC
  Filled 2024-03-19: qty 2

## 2024-03-19 MED ORDER — VERAPAMIL HCL 2.5 MG/ML IV SOLN
INTRAVENOUS | Status: AC
  Filled 2024-03-19: qty 2

## 2024-03-19 MED ORDER — LABETALOL HCL 5 MG/ML IV SOLN: 10.0000 mg | INTRAVENOUS | Status: AC | PRN

## 2024-03-19 MED ORDER — NITROGLYCERIN 1 MG/10 ML FOR IR/CATH LAB
INTRA_ARTERIAL | Status: DC | PRN
  Administered 2024-03-19 (×2): 200 ug via INTRACORONARY

## 2024-03-19 SURGICAL SUPPLY — 15 items
BALLOON EMERGE MR 2.5X15 (BALLOONS) IMPLANT
BALLOON SAPPHIRE NC24 3.0X22 (BALLOONS) IMPLANT
CATH 5FR JL3.5 JR4 ANG PIG MP (CATHETERS) IMPLANT
CATH LAUNCHER 6FR EBU3.5 (CATHETERS) IMPLANT
DEVICE RAD COMP TR BAND LRG (VASCULAR PRODUCTS) IMPLANT
GLIDESHEATH SLEND SS 6F .021 (SHEATH) IMPLANT
GUIDEWIRE INQWIRE 1.5J.035X260 (WIRE) IMPLANT
KIT ENCORE 26 ADVANTAGE (KITS) IMPLANT
KIT SYRINGE INJ CVI SPIKEX1 (MISCELLANEOUS) IMPLANT
PACK CARDIAC CATHETERIZATION (CUSTOM PROCEDURE TRAY) ×1 IMPLANT
SET ATX-X65L (MISCELLANEOUS) IMPLANT
STENT SYNERGY XD 2.50X16 (Permanent Stent) IMPLANT
STENT SYNERGY XD 2.75X28 (Permanent Stent) IMPLANT
WIRE ASAHI GRAND SLAM 180CM (WIRE) IMPLANT
WIRE RUNTHROUGH IZANAI 014 180 (WIRE) IMPLANT

## 2024-03-19 NOTE — Progress Notes (Signed)
   Robert Mooney  FMW:[id number] DOB: September 09, 1950 DOA: 03/17/2024 PCP: Robert Mooney    Brief Narrative:  73 year old with a history of CAD status post PTCA/stenting of the mid circumflex August 2023, HLD, HTN, and DM2 who presented to [facility] with complaints of acute onset of substernal chest pain associated with dyspnea and diaphoresis.  Evaluation revealed NSTEMI with troponin peaking at 5779.  EKG with sinus rhythm with nonspecific ST changes.  The patient was placed on a heparin  drip and transferred to [facility] to undergo cardiology evaluation/cardiac cath.  Goals of Care:   Code Status: Full Code   DVT prophylaxis: SCDs Start: 03/18/24 0819   Interim Hx: Afebrile since admission.  Vital signs stable.  Resting comfortably in bed at the time of my visit.  No chest pain or shortness of breath.  No abdominal pain.  States he understands the need for his cardiac cath today and is anxious to proceed.  Assessment & Plan:  NSTEMI Care per cardiology -for cardiac cath and possible intervention today  HLD Reports intolerance to statins -LDL 129 - trial of PCSK9 inhibitor as outpatient  HTN Blood pressure reasonably controlled  DM2 On metformin as outpatient -CBG well-controlled -A1c 5.8  Obesity - Body mass index is 34.53 kg/m.   Family Communication: Spoke with patient and family at bedside Disposition: Will depend upon results of left heart cath   Objective: Blood pressure (!) 142/65, pulse 65, temperature 98.1 F (36.7 C), temperature source Oral, resp. rate 17, height 5' 8 (1.727 m), weight 103 kg, SpO2 95%.  Intake/Output Summary (Last 24 hours) at 03/19/2024 1029 Last data filed at 03/19/2024 0527 Gross per 24 hour  Intake 873.08 ml  Output 200 ml  Net 673.08 ml   Filed Weights   03/17/24 2112  Weight: 103 kg    Examination: General: No acute respiratory distress Lungs: Clear to auscultation bilaterally without wheezes or  crackles Cardiovascular: Regular rate and rhythm without murmur gallop or rub normal S1 and S2 Abdomen: Nontender, nondistended, soft, bowel sounds positive, no rebound, no ascites, no appreciable mass Extremities: No significant cyanosis, clubbing, or edema bilateral lower extremities  CBC: Recent Labs  Lab 03/17/24 2115 03/18/24 0856 03/19/24 0619  WBC 8.4 8.4 8.1  HGB 14.4 14.8 15.0  HCT 41.5 44.3 44.5  MCV 92.8 95.9 94.3  PLT 225 235 166   Basic Metabolic Panel: Recent Labs  Lab 03/17/24 2200 03/18/24 0856 03/19/24 0619  NA 138 141 138  K 3.6 4.2 4.2  CL 104 102 107  CO2 22 27 22   GLUCOSE 187* 135* 123*  BUN 15 10 8   CREATININE 0.62 0.71 0.74  CALCIUM  9.2 9.5 9.2   GFR: Estimated Creatinine Clearance: 92.8 mL/min (by C-G formula based on SCr of 0.74 mg/dL).   Scheduled Meds:  aspirin  EC  81 mg Oral Daily   cholecalciferol  5,000 Units Oral Daily   gabapentin   300 mg Oral BID   insulin aspart  0-15 Units Subcutaneous TID WC   losartan   50 mg Oral Daily   metoprolol  tartrate  25 mg Oral BID   Continuous Infusions:  heparin  1,250 Units/hr (03/18/24 1827)   nitroGLYCERIN  3 mcg/min (03/18/24 1827)     LOS: 1 day   Robert IVAR Moores, MD [facility] Office  [phone] Pager - Text Page per Tracey  If 7PM-7AM, please contact night-coverage per Amion 03/19/2024, 10:29 AM

## 2024-03-19 NOTE — Interval H&P Note (Signed)
 History and Physical Interval Note:  03/19/2024 4:32 PM  Robert Mooney  has presented today for surgery, with the diagnosis of NSTEMI.  The various methods of treatment have been discussed with the patient and family. After consideration of risks, benefits and other options for treatment, the patient has consented to  Procedure(s): LEFT HEART CATH AND CORONARY ANGIOGRAPHY (N/A) as a surgical intervention.  The patient's history has been reviewed, patient examined, no change in status, stable for surgery.  I have reviewed the patient's chart and labs.  Questions were answered to the patient's satisfaction.    Cath Lab Visit (complete for each Cath Lab visit)  Clinical Evaluation Leading to the Procedure:   ACS: Yes.    Non-ACS:  N/A  [name]

## 2024-03-19 NOTE — Progress Notes (Signed)
  Progress Note  Patient Name: Robert Mooney Date of Encounter: 03/19/2024 [facility] Cardiologist: Robert ONEIDA Decent, MD    Interval Summary   NO further CP  Vital Signs Vitals:   03/18/24 2328 03/19/24 0328 03/19/24 0805 03/19/24 0847  BP: 139/75 135/82 (!) 142/65 (!) 142/65  Pulse: 66 60 65 65  Resp: 18 16 17    Temp: 98 F (36.7 C) 98.5 F (36.9 C) 98.1 F (36.7 C)   TempSrc: Oral Oral Oral   SpO2:  94% 95%   Weight:      Height:        Intake/Output Summary (Last 24 hours) at 03/19/2024 0913 Last data filed at 03/19/2024 0527 Gross per 24 hour  Intake 873.08 ml  Output 200 ml  Net 673.08 ml      03/17/2024    9:12 PM 07/28/2023   12:32 PM 01/28/2023    9:30 AM  Last 3 Weights  Weight (lbs) 227 lb 1.2 oz 227 lb 216 lb 12.8 oz  Weight (kg) 103 kg 102.967 kg 98.34 kg      Telemetry/ECG  Sinus rhythm- Personally Reviewed No EKG today  Physical Exam  GEN: No acute distress.   Neck: No JVD Cardiac: RRR, no murmurs, rubs, or gallops.  Respiratory: Clear to auscultation bilaterally.  Nonlabored, good air movement GI: Soft, nontender, non-distended  MS: No clubbing/cyanosis or edema  Assessment & Plan  Non-STEMI-with known CAD: Troponin peaked at roughly 5780 and trending down.  Anterolateral ST depressions noted.   Remains on IV heparin  and nitroglycerin .  Remains pain-free. He is on 81 mg aspirin  at home but also takes daily Aleve .  (Could consider stopping the aspirin  after 6 months if PCI indicated). no longer on Plavix .,  Although if PCI performed, would likely stay on longer-term Plavix  He is on low-dose Lopressor  which I have increased to 25 mg twice daily along with his losartan  50 mg daily Plans for cardiac catheterization and possible PCI today. Informed Consent   Shared Decision Making/Informed Consent The risks [stroke (1 in 1000), death (1 in 1000), kidney failure [usually temporary] (1 in 500), bleeding (1 in 200), allergic reaction  [possibly serious] (1 in 200)], benefits (diagnostic support and management of coronary artery disease) and alternatives of a cardiac catheterization were discussed in detail with [name] and he is willing to proceed.    Hypertension: BP low bit elevated in house despite being on nitroglycerin , will increase blood pressure to 25 mg twice daily and likely increase losartan  if remains elevated. Hyperlipidemia: Intolerant of statins and Zetia (statin myopathy).  Previously on Repatha  that he stopped because of nerve pain in his thigh when he injected into his thigh.  He would be amenable to restarting, but having stopped it has lost his assistance plan and will need this to be reset up on discharge. DM-2-metformin on hold while in the hospital.  Currently on SSI.  Consider SGLT2 inhibitor on discharge      For questions or updates, please contact [facility] Please consult [email or web address] for contact info under         Signed, Alm Clay, MD

## 2024-03-19 NOTE — H&P (View-Only) (Signed)
  Progress Note  Patient Name: Robert Mooney Date of Encounter: 03/19/2024 [facility] Cardiologist: Robert ONEIDA Decent, MD    Interval Summary   NO further CP  Vital Signs Vitals:   03/18/24 2328 03/19/24 0328 03/19/24 0805 03/19/24 0847  BP: 139/75 135/82 (!) 142/65 (!) 142/65  Pulse: 66 60 65 65  Resp: 18 16 17    Temp: 98 F (36.7 C) 98.5 F (36.9 C) 98.1 F (36.7 C)   TempSrc: Oral Oral Oral   SpO2:  94% 95%   Weight:      Height:        Intake/Output Summary (Last 24 hours) at 03/19/2024 0913 Last data filed at 03/19/2024 0527 Gross per 24 hour  Intake 873.08 ml  Output 200 ml  Net 673.08 ml      03/17/2024    9:12 PM 07/28/2023   12:32 PM 01/28/2023    9:30 AM  Last 3 Weights  Weight (lbs) 227 lb 1.2 oz 227 lb 216 lb 12.8 oz  Weight (kg) 103 kg 102.967 kg 98.34 kg      Telemetry/ECG  Sinus rhythm- Personally Reviewed No EKG today  Physical Exam  GEN: No acute distress.   Neck: No JVD Cardiac: RRR, no murmurs, rubs, or gallops.  Respiratory: Clear to auscultation bilaterally.  Nonlabored, good air movement GI: Soft, nontender, non-distended  MS: No clubbing/cyanosis or edema  Assessment & Plan  Non-STEMI-with known CAD: Troponin peaked at roughly 5780 and trending down.  Anterolateral ST depressions noted.   Remains on IV heparin  and nitroglycerin .  Remains pain-free. He is on 81 mg aspirin  at home but also takes daily Aleve .  (Could consider stopping the aspirin  after 6 months if PCI indicated). no longer on Plavix .,  Although if PCI performed, would likely stay on longer-term Plavix  He is on low-dose Lopressor  which I have increased to 25 mg twice daily along with his losartan  50 mg daily Plans for cardiac catheterization and possible PCI today. Informed Consent   Shared Decision Making/Informed Consent The risks [stroke (1 in 1000), death (1 in 1000), kidney failure [usually temporary] (1 in 500), bleeding (1 in 200), allergic reaction  [possibly serious] (1 in 200)], benefits (diagnostic support and management of coronary artery disease) and alternatives of a cardiac catheterization were discussed in detail with Robert Mooney and he is willing to proceed.    Hypertension: BP low bit elevated in house despite being on nitroglycerin , will increase blood pressure to 25 mg twice daily and likely increase losartan  if remains elevated. Hyperlipidemia: Intolerant of statins and Zetia (statin myopathy).  Previously on Repatha  that he stopped because of nerve pain in his thigh when he injected into his thigh.  He would be amenable to restarting, but having stopped it has lost his assistance plan and will need this to be reset up on discharge. DM-2-metformin on hold while in the hospital.  Currently on SSI.  Consider SGLT2 inhibitor on discharge      For questions or updates, please contact [facility] Please consult [email or web address] for contact info under         Signed, Alm Clay, MD

## 2024-03-19 NOTE — Progress Notes (Signed)
 PHARMACY - ANTICOAGULATION CONSULT NOTE  Pharmacy Consult for heparin  Indication: chest pain/ACS  Allergies  Allergen Reactions   Atorvastatin  Calcium      Other reaction(s): myalgias   Ezetimibe     Other reaction(s): myalgias   Pravastatin Sodium     Other reaction(s): myalgias   Rosuvastatin Calcium      Other reaction(s): myalgias    Patient Measurements: Height: 5' 8 (172.7 cm) Weight: 103 kg (227 lb 1.2 oz) IBW/kg (Calculated) : 68.4 HEPARIN  DW (KG): 90.7  Vital Signs: Temp: 98.1 F (36.7 C) (09/29 0805) Temp Source: Oral (09/29 0805) BP: 142/65 (09/29 0847) Pulse Rate: 65 (09/29 0847)  Labs: Recent Labs    03/17/24 2115 03/17/24 2200 03/17/24 2340 03/18/24 0856 03/18/24 0904 03/18/24 1014 03/18/24 1848 03/19/24 0619  HGB 14.4  --   --  14.8  --   --   --  15.0  HCT 41.5  --   --  44.3  --   --   --  44.5  PLT 225  --   --  235  --   --   --  166  HEPARINUNFRC  --   --   --   --  0.49  --  0.38 0.69  CREATININE  --  0.62  --  0.71  --   --   --  0.74  TROPONINIHS 70*  --    < > 5,779*  --  4,162*  --  2,515*   < > = values in this interval not displayed.    Estimated Creatinine Clearance: 92.8 mL/min (by C-G formula based on SCr of 0.74 mg/dL).   Medical History: Past Medical History:  Diagnosis Date   Anxiety    CAD (coronary artery disease)    a. s/p NSTEMI in 01/2022 with DES to mid-LCx   Diabetes (HCC)    High cholesterol    Hypertension    Assessment: 22 yoM presented to ED with epigastric pain. Pharmacy consulted to dose heparin  for ACS. PMH includes CAD (PCI 2023-completed DAPT x1, ASA only), HLD, HTN, T2DM  -Heparin  level came back therapeutic at 0.69, on 1250 units/hr.  -plans noted for cath today  Goal of Therapy:  Heparin  level 0.3-0.7 units/ml Monitor platelets by anticoagulation protocol: Yes   Plan:  Continue heparin  infusion at 1250 units/hr Will follow plans post cath  Prentice Poisson, PharmD Clinical  Pharmacist **Pharmacist phone directory can now be found on [email or web address] ([email or web address]).  Listed under [facility].

## 2024-03-19 NOTE — Telephone Encounter (Signed)
 Patient Product/process development scientist completed.    The patient is insured through [facility]. Patient has Medicare and is not eligible for a copay card, but may be able to apply for patient assistance or Medicare RX Payment Plan (Patient Must reach out to their plan, if eligible for payment plan), if available.    Ran test claim for Farxiga 10 mg and the current 30 day co-pay is $302.00 due to a $255.00 deductible.  Will be $47.00 once deductible is met.   This test claim was processed through [facility]- copay amounts may vary at other pharmacies due to pharmacy/plan contracts, or as the patient moves through the different stages of their insurance plan.     Reyes Sharps, CPHT Pharmacy Technician III Certified Patient Advocate [facility] Patient Advocate Team Direct Number: [phone]  Fax: [phone]

## 2024-03-19 NOTE — Telephone Encounter (Signed)
 PHARMACIST LIPID MONITORING  Robert Mooney is a 73 y.o. y.o. adult admitted  with NSTEMI.  Pharmacy has been consulted to optimize lipid-lowering therapy with the indication of secondary prevention for clinical ASCVD.   Recent Labs: Lipid Panel:     Component Value Date/Time   CHOL 190 03/19/2024 0619   TRIG 139 03/19/2024 0619   HDL 33 (L) 03/19/2024 0619   CHOLHDL 5.8 03/19/2024 0619   VLDL 28 03/19/2024 0619   LDLCALC 129 (H) 03/19/2024 0619    Hepatic function panel:     Component Value Date/Time   AST 17 03/17/2024 2200   ALT 20 03/17/2024 2200   ALKPHOS 35 (L) 03/17/2024 2200   BILITOT 0.8 03/17/2024 2200    Serum Creatinine  Creatinine, Ser (mg/dL)  Date Value  90/70/7974 0.74   Estimated CrCl of Estimated Creatinine Clearance: 92.8 mL/min (by C-G formula based on SCr of 0.74 mg/dL).  Current therapy and lipid therapy tolerance: Current lipid-lowering therapy: None  Previous lipid-lowering therapies (if applicable): Atorvastatin , zetia, Repatha  Documented or reported allergies or intolerances to lipid-lowering therapies (if applicable): Myalgias    Assessment:    Patient is  Statin-intolerant Baseline LDL: 129 Major ASCVD events: NSTEMI in 2023  High-Risk Conditions: Age >/= 65 years, Diabetes mellitus, and Hypertension  Plan:    Statin intensity: Statin intolerance noted. No statin changes due to serious side effects (e.g. myalgias with at least 2 different statins). Refer to lipid clinic:   Yes

## 2024-03-19 NOTE — Progress Notes (Signed)
  Echocardiogram 2D Echocardiogram has been performed.  Robert Mooney, RDCS 03/19/2024, 2:09 PM

## 2024-03-19 NOTE — Progress Notes (Signed)
 Mobility Specialist Progress Note;   03/19/24 1054  Mobility  Activity Ambulated independently  Level of Assistance Modified independent, requires aide device or extra time  Assistive Device Other (Comment) (IV pole)  Distance Ambulated (ft) 400 ft  Activity Response Tolerated well  Mobility Referral Yes  Mobility visit 1 Mobility  Mobility Specialist Start Time (ACUTE ONLY) 1054  Mobility Specialist Stop Time (ACUTE ONLY) 1104  Mobility Specialist Time Calculation (min) (ACUTE ONLY) 10 min   Pt agreeable to mobility. Required no physical assistance during ambulation. VSS throughout and no c/o when asked. Requested to use BR at [facility]. Pt left with all needs met. Family in room able to assist.   Lauraine Erm Mobility Specialist Please contact via SecureChat or Rehab Office [phone]

## 2024-03-20 ENCOUNTER — Other Ambulatory Visit (HOSPITAL_COMMUNITY): Payer: Self-pay

## 2024-03-20 ENCOUNTER — Telehealth: Payer: Self-pay | Admitting: Cardiology

## 2024-03-20 DIAGNOSIS — E1169 Type 2 diabetes mellitus with other specified complication: Secondary | ICD-10-CM | POA: Diagnosis not present

## 2024-03-20 DIAGNOSIS — I25110 Atherosclerotic heart disease of native coronary artery with unstable angina pectoris: Secondary | ICD-10-CM | POA: Diagnosis not present

## 2024-03-20 DIAGNOSIS — I214 Non-ST elevation (NSTEMI) myocardial infarction: Secondary | ICD-10-CM | POA: Diagnosis not present

## 2024-03-20 DIAGNOSIS — E782 Mixed hyperlipidemia: Secondary | ICD-10-CM

## 2024-03-20 LAB — BASIC METABOLIC PANEL WITH GFR
Anion gap: 12 (ref 5–15)
BUN: 8 mg/dL (ref 8–23)
CO2: 26 mmol/L (ref 22–32)
Calcium: 9.3 mg/dL (ref 8.9–10.3)
Chloride: 101 mmol/L (ref 98–111)
Creatinine, Ser: 0.82 mg/dL (ref 0.61–1.24)
GFR, Estimated: >60 mL/min (ref >60)
Glucose, Bld: 99 mg/dL (ref 70–99)
Potassium: 4 mmol/L (ref 3.5–5.1)
Sodium: 139 mmol/L (ref 135–145)

## 2024-03-20 LAB — GLUCOSE, CAPILLARY
Glucose-Capillary: 112 mg/dL — ABNORMAL HIGH (ref 70–99)
Glucose-Capillary: 109 mg/dL — ABNORMAL HIGH (ref 70–99)
Glucose-Capillary: 153 mg/dL — ABNORMAL HIGH (ref 70–99)

## 2024-03-20 LAB — DIFFERENTIAL
Abs Immature Granulocytes: 0.02 K/uL (ref 0.00–0.07)
Basophils Absolute: 0.1 K/uL (ref 0.0–0.1)
Basophils Relative: 1 %
Eosinophils Absolute: 0.1 K/uL (ref 0.0–0.5)
Eosinophils Relative: 1 %
Immature Granulocytes: 0 %
Lymphocytes Relative: 9 %
Lymphs Abs: 0.8 K/uL (ref 0.7–4.0)
Monocytes Absolute: 1.1 K/uL — ABNORMAL HIGH (ref 0.1–1.0)
Monocytes Relative: 12 %
Neutro Abs: 7 K/uL (ref 1.7–7.7)
Neutrophils Relative %: 77 %

## 2024-03-20 LAB — CBC
HCT: 42 % (ref 39.0–52.0)
Hemoglobin: 14.3 g/dL (ref 13.0–17.0)
MCH: 31.5 pg (ref 26.0–34.0)
MCHC: 34 g/dL (ref 30.0–36.0)
MCV: 92.5 fL (ref 80.0–100.0)
Platelets: 204 K/uL (ref 150–400)
RBC: 4.54 MIL/uL (ref 4.22–5.81)
RDW: 12.3 % (ref 11.5–15.5)
WBC: 7.8 K/uL (ref 4.0–10.5)
nRBC: 0 % (ref 0.0–0.2)

## 2024-03-20 MED ORDER — CLOPIDOGREL BISULFATE 75 MG PO TABS
75.0000 mg | ORAL_TABLET | Freq: Every day | ORAL | 3 refills | Status: AC
  Filled 2024-03-20: qty 90, 90d supply, fill #0

## 2024-03-20 MED ORDER — METFORMIN HCL 500 MG PO TABS: 500.0000 mg | ORAL_TABLET | Freq: Two times a day (BID) | ORAL | Status: AC

## 2024-03-20 MED ORDER — NITROGLYCERIN 0.4 MG SL SUBL
0.4000 mg | SUBLINGUAL_TABLET | SUBLINGUAL | 2 refills | Status: AC | PRN
  Filled 2024-03-20: qty 25, 8d supply, fill #0

## 2024-03-20 NOTE — Telephone Encounter (Signed)
 Patient contacted by clinical team regarding discharge from hospital on 03/20/2024.  Patient understands to follow up with provider Scot Ford PA. on 03/20/2024 at 2:45PM at [address] [facility]. Patient understands discharge instructions? YES Patient understands medications and how to take them? YES Patient understands to bring all medications to this visit? YES Patient understands all medications (except pain medications) will be filled by your Cardiologist AFTER your first appointment. YES Patient reports appropriate help at home with ADL's and has DME at this time. YES Patient reports they have been contacted by home health if ordered.  NA Ask patient:  Are you enrolled in My Chart (NO, and has no interest) If no ask patient if they would like to enroll.   Lonni Glendia Richards RN, BSN, CCRN-A

## 2024-03-20 NOTE — Progress Notes (Signed)
 CARDIAC REHAB PHASE I   PRE:  Rate/Rhythm: 88 NSR  BP:  Sitting: 155/79      SpO2: 96 RA  MODE:  Ambulation: 430 ft    POST:  Rate/Rhythm: 94 NSR  BP:  Sitting: 153/73      SpO2: 96 RA  Pt amb with supervision assistance, pt walked w/o chest pain or other unusual symptoms.   Pt was educated on NSTEMI, stent card, stent location, Antiplatelet and ASA use, wt restrictions, no baths/daily wash-ups, s/s of infection, ex guidelines, s/s to stop exercising, NTG use and calling 911, heart healthy diet, risk factors and CRPII. Pt received MI book and materials on exercise, diet, and CRPII. Will refer to [facility].   Pt does not want to complete CR as he does not like driving.   Garen FORBES Candy  MS, ACSM-CEP 11:06 AM 03/20/2024    Service time is from 1023 to 1106.

## 2024-03-20 NOTE — Progress Notes (Addendum)
 Progress Note  Patient Name: Robert Mooney Date of Encounter: 03/20/2024 [facility] Cardiologist: Darryle ONEIDA Decent, MD   Interval Summary    No issues overnight, ready to go home.   Vital Signs Vitals:   03/19/24 2004 03/19/24 2322 03/20/24 0256 03/20/24 0749  BP: (!) 148/74 132/75 126/71 136/77  Pulse: 90 63 62   Resp: 18 20 20 18   Temp: 97.9 F (36.6 C) 98 F (36.7 C) 97.7 F (36.5 C) 97.7 F (36.5 C)  TempSrc: Oral Oral Oral Oral  SpO2: 99% 98% 97%   Weight:      Height:        Intake/Output Summary (Last 24 hours) at 03/20/2024 0900 Last data filed at 03/19/2024 2004 Gross per 24 hour  Intake 500 ml  Output 300 ml  Net 200 ml      03/17/2024    9:12 PM 07/28/2023   12:32 PM 01/28/2023    9:30 AM  Last 3 Weights  Weight (lbs) 227 lb 1.2 oz 227 lb 216 lb 12.8 oz  Weight (kg) 103 kg 102.967 kg 98.34 kg      Telemetry/ECG  Sinus Rhythm 60s - Personally Reviewed  Physical Exam  GEN: No acute distress.   Neck: No JVD Cardiac: RRR, no murmurs, rubs, or gallops.  Respiratory: Clear to auscultation bilaterally. GI: Soft, nontender, non-distended  MS: No edema Vas: right radial cath site stable.   Assessment & Plan   73 y.o. male with a hx of CAD, hypertension, DM2, hyperlipidemia who was seen 03/18/2024 for the evaluation of chest pain at the request of Dr. Vicci.   Non-STEMI -- High-sensitivity troponin peaked at 5779, underwent cardiac catheterization 9/29 with severe two-vessel CAD with multifocal 40 to 50% proximal/mid LAD followed by a 90% mid LAD stenosis and 99% proximal left circumflex lesion.  Underwent successful PCI to proximal circumflex with DES x 1 overlapping previously placed stent as well as PCI/DES x 1 to mid LAD.  Recommendations for DAPT with aspirin /Plavix  for at least 1 year. -- Echocardiogram 9/29 with LVEF of 55 to 60%, normal RV, no significant valvular disease -- continue ASA, plavix , losartan    Hypertension --  Controlled -- Continue losartan  50 mg daily, initially started on metoprolol  but reports issues in the past with severe fatigue.  Given his LVEF is normal seems reasonable to stop this.  Can further titrate losartan  as needed.  Hyperlipidemia -- Intolerant of statins and Zetia, previously on Repatha  but was apparently stopped because of nerve pain.  Amendable to restarting, will refer back to the lipid clinic -- LDL 129, HDL 33  Diabetes -- Hemoglobin A1c 5.8 -- On metformin PTA, could consider SGLT2 but has a $300 deductible  Will arrange outpatient follow up in the office.   For questions or updates, please contact [facility] Please consult [email or web address] for contact info under    Signed, Manuelita Rummer, NP    ATTENDING ATTESTATION  I have seen, examined and evaluated the patient this morning on rounds along with Manuelita Rummer, NP.  After reviewing all the available data and chart, we discussed the patients laboratory, study & physical findings as well as symptoms in detail.  I agree with her findings, examination as well as impression recommendations as per our discussion.    Attending adjustments noted in italics.   Patient looks good and feels well this morning.  Ambulating in the hallway without any difficulty.  No further angina.  Cath films reviewed.  Discussed results  with patient.  Medication adjustments made as noted.  Okay to hold off on beta-blocker for now.  Can discuss in outpatient setting.  Stable for discharge.    Alm MICAEL Clay, MD, MS Alm Clay, M.D., M.S. Interventional Cardiologist  [facility] Pager # [phone]

## 2024-03-20 NOTE — Plan of Care (Signed)
  Problem: Health Behavior/Discharge Planning: Goal: Ability to manage health-related needs will improve Outcome: Adequate for Discharge   Problem: Clinical Measurements: Goal: Ability to maintain clinical measurements within normal limits will improve Outcome: Adequate for Discharge   Problem: Clinical Measurements: Goal: Cardiovascular complication will be avoided Outcome: Adequate for Discharge   Problem: Activity: Goal: Risk for activity intolerance will decrease Outcome: Adequate for Discharge   Problem: Coping: Goal: Level of anxiety will decrease Outcome: Adequate for Discharge   Problem: Education: Goal: Ability to describe self-care measures that may prevent or decrease complications (Diabetes Survival Skills Education) will improve Outcome: Adequate for Discharge   Problem: Metabolic: Goal: Ability to maintain appropriate glucose levels will improve Outcome: Adequate for Discharge   Problem: Tissue Perfusion: Goal: Adequacy of tissue perfusion will improve Outcome: Adequate for Discharge   Problem: Education: Goal: Understanding of CV disease, CV risk reduction, and recovery process will improve Outcome: Adequate for Discharge   Problem: Cardiovascular: Goal: Ability to achieve and maintain adequate cardiovascular perfusion will improve Outcome: Adequate for Discharge   Problem: Health Behavior/Discharge Planning: Goal: Ability to safely manage health-related needs after discharge will improve Outcome: Adequate for Discharge

## 2024-03-20 NOTE — Telephone Encounter (Signed)
   Transition of Care Follow-up Phone Call Request    Patient Name: Robert Mooney Date of Birth: 1951/02/28 Date of Encounter: 03/20/2024  Primary Care Provider:  [name], PA-C Primary Cardiologist:  Darryle ONEIDA Decent, MD  Robert Mooney has been scheduled for a transition of care follow up appointment with a [facility] provider:  Scot Ford 10/8  Please reach out to Robert Mooney within 48 hours of discharge to confirm appointment and review transition of care protocol questionnaire. Anticipated discharge date: 9/30  Manuelita Rummer, NP  03/20/2024, 9:35 AM

## 2024-03-20 NOTE — Discharge Summary (Signed)
 DISCHARGE SUMMARY  Robert Mooney  MR#: [id number]  DOB:May 28, 1951  Date of Admission: 03/17/2024 Date of Discharge: 03/20/2024  Attending Physician:Shrinika Blatz ONEIDA Moores, MD  Patient's ERE:Robert Mooney, Robert BROCKS, PA-C  Disposition: Discharge home  Follow-up Appts:  Follow-up Information     Lanis Robert BROCKS, PA-C Follow up in 2 week(s).   Specialty: Family Medicine Contact information: [address] [place] [address] [phone]                  Discharge Diagnoses: NSTEMI - CAD  HLD HTN DM2 Obesity - Body mass index is 34.53 kg/m.  Initial presentation: 73 year old with a history of CAD status post PTCA/stenting of the mid circumflex August 2023, HLD, HTN, and DM2 who presented to [facility] with complaints of acute onset of substernal chest pain associated with dyspnea and diaphoresis.  Evaluation revealed NSTEMI with troponin peaking at 5779.  EKG with sinus rhythm with nonspecific ST changes.  The patient was placed on a heparin  drip and transferred to [facility] to undergo Cardiology evaluation/cardiac cath.   Hospital Course:  NSTEMI Care per Cardiology as follows:  -- High-sensitivity troponin peaked at 5779, underwent cardiac catheterization 9/29 with severe two-vessel CAD with multifocal 40 to 50% proximal/mid LAD followed by a 90% mid LAD stenosis and 99% proximal left circumflex lesion.  Underwent successful PCI to proximal circumflex with DES x 1 overlapping previously placed stent as well as PCI/DES x 1 to mid LAD.  Recommendations for DAPT with aspirin /Plavix  for at least 1 year. -- Echocardiogram 9/29 with LVEF of 55 to 60%, normal RV, no significant valvular disease -- continue ASA, plavix , losartan   --outpatient follow up to be arranged by Cardiology   HLD Care per Cardiology as follows: - Intolerant of statins and Zetia, previously on Repatha  but was apparently stopped because of nerve pain.  Amendable to restarting, will  refer back to the lipid clinic -- LDL 129, HDL 33   HTN Blood pressure reasonably controlled - Cards recs as follows: -- Continue losartan  50 mg daily, initially started on metoprolol  but reports issues in the past with severe fatigue.  Given his LVEF is normal seems reasonable to stop this.  Can further titrate losartan  as needed.    DM2 On metformin as outpatient - CBG well-controlled - A1c 5.8 - consider SGLT2 at time of f/u visit, but will have a high deductible at $300   Obesity - Body mass index is 34.53 kg/m.  Allergies as of 03/20/2024       Reactions   Atorvastatin  Calcium     Other reaction(s): myalgias   Ezetimibe    Other reaction(s): myalgias   Pravastatin Sodium    Other reaction(s): myalgias   Rosuvastatin Calcium     Other reaction(s): myalgias        Medication List     STOP taking these medications    Garlic 1000 MG Caps   naproxen  sodium 220 MG tablet Commonly known as: ALEVE        TAKE these medications    acetaminophen  650 MG CR tablet Commonly known as: TYLENOL  Take 650 mg by mouth every 8 (eight) hours as needed for pain.   Aspirin  Low Dose 81 MG tablet Generic drug: aspirin  EC Take 1 tablet (81 mg total) by mouth daily. Swallow whole.   bismuth subsalicylate 262 MG chewable tablet Commonly known as: PEPTO BISMOL Chew 524 mg by mouth as needed for indigestion.   clopidogrel  75 MG tablet Commonly  known as: PLAVIX  Take 1 tablet (75 mg total) by mouth daily with breakfast.   diphenhydrAMINE 25 MG tablet Commonly known as: BENADRYL Take 25-50 mg by mouth every 6 (six) hours as needed for allergies.   gabapentin  600 MG tablet Commonly known as: NEURONTIN  Take 300 mg by mouth 2 (two) times daily.   losartan  50 MG tablet Commonly known as: COZAAR  Take 50 mg by mouth daily.   metFORMIN 500 MG tablet Commonly known as: GLUCOPHAGE Take 1 tablet (500 mg total) by mouth 2 (two) times daily. Start taking on: March 22, 2024 What  changed: These instructions start on March 22, 2024. If you are unsure what to do until then, ask your doctor or other care provider.   nitroGLYCERIN  0.4 MG SL tablet Commonly known as: NITROSTAT  Place 1 tablet (0.4 mg total) under the tongue every 5 (five) minutes as needed for chest pain.   Vitamin D3 125 MCG (5000 UT) Caps Take 1 capsule by mouth daily.        Day of Discharge BP 132/64 (BP Location: Left Arm)   Pulse 74   Temp 97.7 F (36.5 C) (Oral)   Resp 17   Ht 5' 8 (1.727 m)   Wt 103 kg   SpO2 96%   BMI 34.53 kg/m   Physical Exam: General: No acute respiratory distress Lungs: Clear to auscultation bilaterally without wheezes or crackles Cardiovascular: Regular rate and rhythm without murmur gallop or rub normal S1 and S2 Abdomen: Nontender, nondistended, soft, bowel sounds positive, no rebound, no ascites, no appreciable mass Extremities: No significant cyanosis, clubbing, or edema bilateral lower extremities  Basic Metabolic Panel: Recent Labs  Lab 03/17/24 2200 03/18/24 0856 03/19/24 0619 03/20/24 0517  NA 138 141 138 139  K 3.6 4.2 4.2 4.0  CL 104 102 107 101  CO2 22 27 22 26   GLUCOSE 187* 135* 123* 99  BUN 15 10 8 8   CREATININE 0.62 0.71 0.74 0.82  CALCIUM  9.2 9.5 9.2 9.3    CBC: Recent Labs  Lab 03/17/24 2115 03/18/24 0856 03/19/24 0619 03/20/24 0517 03/20/24 0920  WBC 8.4 8.4 8.1 7.8  --   NEUTROABS  --   --   --   --  7.0  HGB 14.4 14.8 15.0 14.3  --   HCT 41.5 44.3 44.5 42.0  --   MCV 92.8 95.9 94.3 92.5  --   PLT 225 235 166 204  --     Time spent in discharge (includes decision making & examination of pt): 30 minutes  03/20/2024, 11:56 AM   [name], MD [facility] Office  [phone]

## 2024-03-20 NOTE — Plan of Care (Signed)

## 2024-03-20 NOTE — TOC CM/SW Note (Signed)
 Transition of Care [facility]) - Inpatient Brief Assessment   Patient Details  Name: Robert Mooney MRN: [id number] Date of Birth: [birth year]  [facility] CM/SW Contact:    Sudie Erminio Deems, RN Phone Number: 03/20/2024, 12:58 PM   Clinical Narrative: Patient presented as a transfer from [facility]. PTA patient was from home with support of spouse. Patient has insurance and PCP. No home needs identified. Patient to transition home today.    Transition of Care Asessment: Insurance and Status: Insurance coverage has been reviewed Patient has primary care physician: Yes Home environment has been reviewed: reviewed Prior level of function:: independent Prior/Current Home Services: No current home services Social Drivers of Health Review: SDOH reviewed no interventions necessary Readmission risk has been reviewed: Yes Transition of care needs: no transition of care needs at this time

## 2024-03-28 ENCOUNTER — Encounter: Payer: Self-pay | Admitting: Physician Assistant

## 2024-03-28 ENCOUNTER — Ambulatory Visit: Attending: Cardiology | Admitting: Physician Assistant

## 2024-03-28 VITALS — BP 122/68 | HR 79 | Resp 16 | Ht 68.0 in | Wt 221.8 lb

## 2024-03-28 DIAGNOSIS — R0602 Shortness of breath: Secondary | ICD-10-CM

## 2024-03-28 DIAGNOSIS — I10 Essential (primary) hypertension: Secondary | ICD-10-CM | POA: Diagnosis not present

## 2024-03-28 DIAGNOSIS — I2510 Atherosclerotic heart disease of native coronary artery without angina pectoris: Secondary | ICD-10-CM

## 2024-03-28 DIAGNOSIS — E782 Mixed hyperlipidemia: Secondary | ICD-10-CM

## 2024-03-28 NOTE — Progress Notes (Unsigned)
  Cardiology Office Note   Date:  03/28/2024  ID:  BURREL LEGRAND, DOB [birth year], MRN [id number] PCP: Lanis Thresa [name]  [facility] Providers Cardiologist:  Darryle ONEIDA Decent, MD { Click to update primary MD,subspecialty MD or APP then REFRESH:1}    History of Present Illness Robert Mooney is a 73 y.o. male with past medical history of CAD, hypertension and hyperlipidemia.  Patient had NSTEMI in August 2023 and underwent PCI of mid left circumflex artery, he had a residual disease in small OM branch that was managed medically.  He has intolerance to multiple lipid-lowering medication including statins, Zetia, Repatha  and bempdoic acid  He has resorted to dietary modification.  More recently, patient presented to the hospital with recurrent NSTEMI on 03/18/2024.  Echocardiogram obtained on 03/19/2024 showed EF 55 to 60%, no regional wall motion abnormality, normal RV, no significant valve issue.  Cardiac catheterization performed on 03/19/2024 showed severe two-vessel CAD with 40 to 50% proximal to mid LAD lesion followed by discrete 90% mid LAD lesion as well as 99% proximal left circumflex lesion, EF 55 to 65%.  He underwent successful placement of 2.75 x 28 mm Synergy DES to proximal left circumflex lesion and 2.5 x 16 mm DES to mid LAD.  Postprocedure, he was placed on 65-month course of aspirin  and Plavix .  Echocardiogram obtained on 03/19/2024 showed EF 55 to 60%, grade 1 DD, normal RV.  It was recommended to consider PCSK9 inhibitor as outpatient    ROS: ***  Studies Reviewed      *** Risk Assessment/Calculations {Does this patient have ATRIAL FIBRILLATION?:[phone]}         Physical Exam VS:  BP 122/68 (BP Location: Left Arm, Patient Position: Sitting)   Pulse 79   Resp 16   Ht 5' 8 (1.727 m)   Wt 221 lb 12.8 oz (100.6 kg)   SpO2 97%   BMI 33.72 kg/m        Wt Readings from Last 3 Encounters:  03/28/24 221 lb 12.8 oz (100.6 kg)  03/17/24 227 lb 1.2 oz (103  kg)  07/28/23 227 lb (103 kg)    GEN: Well nourished, well developed in no acute distress NECK: No JVD; No carotid bruits CARDIAC: ***RRR, no murmurs, rubs, gallops RESPIRATORY:  Clear to auscultation without rales, wheezing or rhonchi  ABDOMEN: Soft, non-tender, non-distended EXTREMITIES:  No edema; No deformity   ASSESSMENT AND PLAN ***    {Are you ordering a CV Procedure (e.g. stress test, cath, DCCV, TEE, etc)?   Press F2        :[id number]}  Dispo: ***  Signed, Scot Ford, PA

## 2024-03-28 NOTE — Patient Instructions (Addendum)
 Medication Instructions:  NO CHANGES  Lab Work: NONE TO BE DONE TODAY.  Testing/Procedures: NONE  Follow-Up: At [facility], you and your health needs are our priority.  As part of our continuing mission to provide you with exceptional heart care, our providers are all part of one team.  This team includes your primary Cardiologist (physician) and Advanced Practice Providers or APPs (Physician Assistants and Nurse Practitioners) who all work together to provide you with the care you need, when you need it.  Your next appointment:   3-4 MONTHS  Provider:   Darryle ONEIDA Decent, MD   We recommend signing up for the patient portal called MyChart.  Sign up information is provided on this After Visit Summary.  MyChart is used to connect with patients for Virtual Visits (Telemedicine).  Patients are able to view lab/test results, encounter notes, upcoming appointments, etc.  Non-urgent messages can be sent to your provider as well.   To learn more about what you can do with MyChart, go to [email or web address].   Other Instructions:  A REFERRAL TO THE PHARM-D LIPID CLINIC HAS BEEN PUT IN AND SOMEONE FROM OUR OFFICE WILL CONTACT YOU TO GET YOU SCHEDULED.

## 2024-03-29 DIAGNOSIS — I214 Non-ST elevation (NSTEMI) myocardial infarction: Secondary | ICD-10-CM | POA: Diagnosis not present

## 2024-03-29 DIAGNOSIS — E782 Mixed hyperlipidemia: Secondary | ICD-10-CM | POA: Diagnosis not present

## 2024-03-29 DIAGNOSIS — I2510 Atherosclerotic heart disease of native coronary artery without angina pectoris: Secondary | ICD-10-CM | POA: Diagnosis not present

## 2024-04-09 ENCOUNTER — Other Ambulatory Visit (HOSPITAL_COMMUNITY): Payer: Self-pay

## 2024-04-09 ENCOUNTER — Telehealth: Payer: Self-pay | Admitting: Pharmacy Technician

## 2024-04-09 ENCOUNTER — Encounter: Payer: Self-pay | Admitting: Pharmacist

## 2024-04-09 ENCOUNTER — Telehealth: Payer: Self-pay | Admitting: Pharmacist

## 2024-04-09 ENCOUNTER — Ambulatory Visit: Attending: Cardiovascular Disease | Admitting: Pharmacist

## 2024-04-09 DIAGNOSIS — E7849 Other hyperlipidemia: Secondary | ICD-10-CM

## 2024-04-09 MED ORDER — REPATHA SURECLICK 140 MG/ML ~~LOC~~ SOAJ
140.0000 mg | SUBCUTANEOUS | 3 refills | Status: AC
  Filled 2024-04-09: qty 6, 84d supply, fill #0
  Filled 2024-06-25: qty 6, 84d supply, fill #1

## 2024-04-09 NOTE — Telephone Encounter (Signed)
 Patient Advocate Encounter   The patient was approved for a [employer] grant that will help cover the cost of Repatha  Total amount awarded, 2500.00.  Effective: 03/10/24 - 03/09/25   APW:[id number] ERW:[id number] Hmnle:[id number] PI:[id number]  [employer] ID: [id number]   Pharmacy provided with approval and processing information. Patient informed via call

## 2024-04-09 NOTE — Addendum Note (Signed)
 Addended by: Kauan Kloosterman K on: 04/09/2024 01:16 PM   Modules accepted: Level of Service

## 2024-04-09 NOTE — Telephone Encounter (Signed)
 Pharmacy Patient Advocate Encounter  Received notification from [facility] that Prior Authorization for repatha  has been APPROVED from 04/09/24 to 06/20/25. Ran test claim, Copay is $302.00. This test claim was processed through [facility]- copay amounts may vary at other pharmacies due to pharmacy/plan contracts, or as the patient moves through the different stages of their insurance plan.   PA #/Case ID/Reference #: [id number]

## 2024-04-09 NOTE — Telephone Encounter (Signed)
 Repatha  grant approved

## 2024-04-09 NOTE — Telephone Encounter (Signed)
     Pharmacy Patient Advocate Encounter   Received notification from Pt Calls Messages that prior authorization for repatha  is required/requested.   Insurance verification completed.   The patient is insured through [facility].   Per test claim: PA required; PA submitted to above mentioned insurance via Latent Key/confirmation #/EOC [id number] Status is pending

## 2024-04-09 NOTE — Progress Notes (Addendum)
 Patient ID: Robert Mooney                 DOB: Apr 02, 1951                    MRN: [id number]      HPI: Robert Mooney is a 73 y.o. male patient referred to lipid clinic by Dr. Danton. PMH is significant for  CAD, hypertension and hyperlipidemia.  Patient had NSTEMI in August 2023 and underwent PCI of mid left circumflex artery, he had a residual disease in small OM branch that was managed medically.  He has intolerance to multiple lipid-lowering medication including statins, Zetia, Repatha  and bempdoic acid. recurrent NSTEMI on 03/18/2024. recurrent NSTEMI on 03/18/2024.  The patient presented today for a lipid clinic follow-up. He reported prior use of Repatha , which he initially tolerated well. However, he recalled an incident where he accidentally injected the medication incorrectly into his thigh, after which he developed neuropathy-like symptoms. Despite discontinuing Repatha , the symptoms persisted, suggesting the neuropathy was not related to the medication. The patient manages his diabetes with metformin and dietary modifications, and his A1c remains in the prediabetic range. Following his first myocardial infarction, the patient had trialed Zetia, but discontinued it due to gastrointestinal intolerance (upset stomach). Reviewed options for lowering LDL cholesterol, including  PCSK-9 inhibitors, bempedoic acid  and inclisiran.  Discussed mechanisms of action, dosing, side effects and potential decreases in LDL cholesterol.  Also reviewed cost information and potential options for patient assistance.   Current Medications: none  Intolerances: pravastatin, atorvastatin  and rosuvastatin - myalgia, Ezetimibe - myalgia  Risk Factors: CAD, recurrent NSTEMI, T2DM, HTN  LDL goal: <55 TG <150  Last lab 03/19/2024 TC 190, TG 139, HDL 33, LDLc 129  Diet: heart healthy diet   Exercise: walking - 10 min twice daily   Family History:  Was adopted  His real mother had diabetes  Social History:   Smoking: none  Alcohol- none  Labs:  Lipid Panel     Component Value Date/Time   CHOL 190 03/19/2024 0619   TRIG 139 03/19/2024 0619   HDL 33 (L) 03/19/2024 0619   CHOLHDL 5.8 03/19/2024 0619   VLDL 28 03/19/2024 0619   LDLCALC 129 (H) 03/19/2024 0619    Past Medical History:  Diagnosis Date   Anxiety    CAD (coronary artery disease)    a. s/p NSTEMI in 01/2022 with DES to mid-LCx   Diabetes (HCC)    High cholesterol    Hypertension     Current Outpatient Medications on File Prior to Visit  Medication Sig Dispense Refill   acetaminophen  (TYLENOL ) 650 MG CR tablet Take 650 mg by mouth every 8 (eight) hours as needed for pain.     aspirin  EC 81 MG tablet Take 1 tablet (81 mg total) by mouth daily. Swallow whole. 90 tablet 2   bismuth subsalicylate (PEPTO BISMOL) 262 MG chewable tablet Chew 524 mg by mouth as needed for indigestion.     Cholecalciferol (VITAMIN D3) 125 MCG (5000 UT) CAPS Take 1 capsule by mouth daily.     clopidogrel  (PLAVIX ) 75 MG tablet Take 1 tablet (75 mg total) by mouth daily with breakfast. 90 tablet 3   diphenhydrAMINE (BENADRYL) 25 MG tablet Take 25-50 mg by mouth every 6 (six) hours as needed for allergies.     gabapentin  (NEURONTIN ) 600 MG tablet Take 300 mg by mouth 2 (two) times daily.     losartan  (COZAAR ) 50 MG tablet  Take 50 mg by mouth daily.     metFORMIN (GLUCOPHAGE) 500 MG tablet Take 1 tablet (500 mg total) by mouth 2 (two) times daily.     nitroGLYCERIN  (NITROSTAT ) 0.4 MG SL tablet Place 1 tablet (0.4 mg total) under the tongue every 5 (five) minutes as needed for chest pain. 25 tablet 2   No current facility-administered medications on file prior to visit.    Allergies  Allergen Reactions   Atorvastatin  Calcium      Other reaction(s): myalgias   Ezetimibe     Other reaction(s): myalgias   Pravastatin Sodium     Other reaction(s): myalgias   Rosuvastatin Calcium      Other reaction(s): myalgias    Assessment/Plan:  1.  Hyperlipidemia -  Problem  Hld (Hyperlipidemia)   HLD (hyperlipidemia) Assessment:  LDL goal: < 55 mg/dl last LDLc 870 mg/dl (90/77974)  Intolerance to multiple statins and Zetia  statins  Discussed next potential options (PCSK-9 inhibitors, bempedoic acid  and inclisiran); cost, dosing efficacy, side effects  Repatha  PA approved pt will retry Repatha  as the neuropathy did not improved when he stopped taking it  Pt can't afford the co-pay so will enroll in [facility] grant   Plan: Start taking Repatha  140 mg every 14 days  Will get updated lab end of Jan 2026     Thank you,  Robbi Blanch, Pharm.D [place] [facility]. [facility] [address], [place], [place] [address] Phone: [phone]; Fax: [phone]

## 2024-04-09 NOTE — Assessment & Plan Note (Signed)
 Assessment:  LDL goal: < 55 mg/dl last LDLc 870 mg/dl (90/77974)  Intolerance to multiple statins and Zetia  statins  Discussed next potential options (PCSK-9 inhibitors, bempedoic acid  and inclisiran); cost, dosing efficacy, side effects  Repatha  PA approved pt will retry Repatha  as the neuropathy did not improved when he stopped taking it  Pt can't afford the co-pay so will enroll in [facility] grant   Plan: Start taking Repatha  140 mg every 14 days  Will get updated lab end of Jan 2026

## 2024-04-10 NOTE — Telephone Encounter (Signed)
 PA for Repatha approved.

## 2024-06-07 ENCOUNTER — Other Ambulatory Visit (HOSPITAL_COMMUNITY): Payer: Self-pay

## 2024-06-25 ENCOUNTER — Other Ambulatory Visit (HOSPITAL_COMMUNITY): Payer: Self-pay

## 2024-06-26 ENCOUNTER — Other Ambulatory Visit (HOSPITAL_COMMUNITY): Payer: Self-pay

## 2024-07-11 ENCOUNTER — Ambulatory Visit: Payer: Medicare Other | Admitting: Urology

## 2024-07-18 LAB — LIPID PANEL
Chol/HDL Ratio: 3 ratio (ref 0.0–5.0)
Cholesterol, Total: 121 mg/dL (ref 100–199)
HDL: 40 mg/dL
LDL Chol Calc (NIH): 51 mg/dL (ref 0–99)
Triglycerides: 182 mg/dL — ABNORMAL HIGH (ref 0–149)
VLDL Cholesterol Cal: 30 mg/dL (ref 5–40)

## 2024-07-19 ENCOUNTER — Encounter: Payer: Self-pay | Admitting: Pharmacist

## 2024-07-19 ENCOUNTER — Ambulatory Visit: Payer: Self-pay | Admitting: Pharmacist

## 2024-07-19 NOTE — Telephone Encounter (Signed)
 Error

## 2024-07-19 NOTE — Telephone Encounter (Signed)
 Lab results were discussed with the patient over the phone. LDL-C is near goal. The patient was advised to continue Repatha , which he reports tolerating significantly better than statin therapy.

## 2024-07-23 ENCOUNTER — Ambulatory Visit: Admitting: Cardiovascular Disease

## 2024-07-27 ENCOUNTER — Encounter: Payer: Self-pay | Admitting: Cardiovascular Disease

## 2024-07-27 ENCOUNTER — Ambulatory Visit: Admitting: Cardiovascular Disease

## 2024-07-27 VITALS — BP 140/62 | HR 78 | Ht 68.0 in | Wt 230.8 lb

## 2024-07-27 DIAGNOSIS — I2510 Atherosclerotic heart disease of native coronary artery without angina pectoris: Secondary | ICD-10-CM

## 2024-07-27 DIAGNOSIS — E782 Mixed hyperlipidemia: Secondary | ICD-10-CM

## 2024-07-27 DIAGNOSIS — I10 Essential (primary) hypertension: Secondary | ICD-10-CM

## 2024-07-27 NOTE — Patient Instructions (Signed)
 Medication Instructions:  Your physician recommends that you continue on your current medications as directed. Please refer to the Current Medication list given to you today.  *If you need a refill on your cardiac medications before your next appointment, please call your pharmacy*   Follow-Up: At [facility], you and your health needs are our priority.  As part of our continuing mission to provide you with exceptional heart care, our providers are all part of one team.  This team includes your primary Cardiologist (physician) and Advanced Practice Providers or APPs (Physician Assistants and Nurse Practitioners) who all work together to provide you with the care you need, when you need it.  Your next appointment:   8 month(s)  Provider:   Darryle ONEIDA Decent, MD    We recommend signing up for the patient portal called MyChart.  Sign up information is provided on this After Visit Summary.  MyChart is used to connect with patients for Virtual Visits (Telemedicine).  Patients are able to view lab/test results, encounter notes, upcoming appointments, etc.  Non-urgent messages can be sent to your provider as well.   To learn more about what you can do with MyChart, go to [email or web address].   Other Instructions
